# Patient Record
Sex: Female | Born: 1960 | Race: White | Hispanic: No | Marital: Married | Smoking: Former smoker
Health system: Southern US, Community
[De-identification: ages and names within clinical notes are randomized; demographics above are authoritative.]

## PROBLEM LIST (undated history)

## (undated) DIAGNOSIS — L570 Actinic keratosis: Secondary | ICD-10-CM

## (undated) DIAGNOSIS — I10 Essential (primary) hypertension: Secondary | ICD-10-CM

## (undated) DIAGNOSIS — F419 Anxiety disorder, unspecified: Secondary | ICD-10-CM

## (undated) DIAGNOSIS — M1990 Unspecified osteoarthritis, unspecified site: Secondary | ICD-10-CM

## (undated) DIAGNOSIS — T7840XA Allergy, unspecified, initial encounter: Secondary | ICD-10-CM

## (undated) DIAGNOSIS — F32A Depression, unspecified: Secondary | ICD-10-CM

## (undated) HISTORY — DX: Actinic keratosis: L57.0

## (undated) HISTORY — PX: TUBAL LIGATION: SHX77

## (undated) HISTORY — PX: ESSURE TUBAL LIGATION: SUR464

## (undated) HISTORY — DX: Essential (primary) hypertension: I10

## (undated) HISTORY — DX: Unspecified osteoarthritis, unspecified site: M19.90

## (undated) HISTORY — PX: DILATION AND CURETTAGE OF UTERUS: SHX78

## (undated) HISTORY — DX: Anxiety disorder, unspecified: F41.9

## (undated) HISTORY — DX: Allergy, unspecified, initial encounter: T78.40XA

## (undated) HISTORY — DX: Depression, unspecified: F32.A

## (undated) HISTORY — PX: OTHER SURGICAL HISTORY: SHX169

## (undated) HISTORY — PX: TONSILLECTOMY: SUR1361

---

## 2007-07-05 ENCOUNTER — Ambulatory Visit: Payer: Self-pay | Admitting: Unknown Physician Specialty

## 2011-09-05 ENCOUNTER — Ambulatory Visit: Payer: Self-pay | Admitting: Family Medicine

## 2012-03-08 ENCOUNTER — Ambulatory Visit: Payer: Self-pay | Admitting: Gastroenterology

## 2014-10-06 LAB — HEPATIC FUNCTION PANEL
ALT: 18 U/L (ref 7–35)
AST: 22 U/L (ref 13–35)
Alkaline Phosphatase: 103 U/L (ref 25–125)
Bilirubin, Total: 0.3 mg/dL

## 2014-10-06 LAB — HEMOGLOBIN A1C: Hemoglobin A1C: 5.8

## 2014-10-06 LAB — CBC AND DIFFERENTIAL
HCT: 40 % (ref 36–46)
Hemoglobin: 13.3 g/dL (ref 12.0–16.0)
Neutrophils Absolute: 2 /uL
Platelets: 239 10*3/uL (ref 150–399)
WBC: 3.9 10^3/mL

## 2014-10-06 LAB — LIPID PANEL
Cholesterol: 175 mg/dL (ref 0–200)
HDL: 57 mg/dL (ref 35–70)
LDL Cholesterol: 90 mg/dL
Triglycerides: 139 mg/dL (ref 40–160)

## 2014-10-06 LAB — BASIC METABOLIC PANEL
BUN: 14 mg/dL (ref 4–21)
Creatinine: 0.8 mg/dL (ref 0.5–1.1)
Glucose: 91 mg/dL
Potassium: 4.2 mmol/L (ref 3.4–5.3)
Sodium: 142 mmol/L (ref 137–147)

## 2014-10-06 LAB — TSH: TSH: 2.49 u[IU]/mL (ref 0.41–5.90)

## 2014-10-27 ENCOUNTER — Other Ambulatory Visit: Payer: Self-pay | Admitting: Family Medicine

## 2014-10-27 DIAGNOSIS — M62838 Other muscle spasm: Secondary | ICD-10-CM | POA: Insufficient documentation

## 2014-12-09 ENCOUNTER — Other Ambulatory Visit: Payer: Self-pay | Admitting: Family Medicine

## 2014-12-09 DIAGNOSIS — G4700 Insomnia, unspecified: Secondary | ICD-10-CM

## 2014-12-10 DIAGNOSIS — G4700 Insomnia, unspecified: Secondary | ICD-10-CM | POA: Insufficient documentation

## 2014-12-10 NOTE — Telephone Encounter (Signed)
Last ov 09/2014

## 2015-01-29 ENCOUNTER — Other Ambulatory Visit: Payer: Self-pay | Admitting: Family Medicine

## 2015-01-29 DIAGNOSIS — J309 Allergic rhinitis, unspecified: Secondary | ICD-10-CM

## 2015-01-29 NOTE — Telephone Encounter (Signed)
Last ov was on 10/02/2014.  Thanks,

## 2015-03-03 ENCOUNTER — Telehealth: Payer: Self-pay | Admitting: Family Medicine

## 2015-03-13 ENCOUNTER — Ambulatory Visit (INDEPENDENT_AMBULATORY_CARE_PROVIDER_SITE_OTHER): Payer: 59

## 2015-03-13 DIAGNOSIS — Z23 Encounter for immunization: Secondary | ICD-10-CM

## 2015-07-01 DIAGNOSIS — R7303 Prediabetes: Secondary | ICD-10-CM | POA: Insufficient documentation

## 2015-07-01 DIAGNOSIS — Z87898 Personal history of other specified conditions: Secondary | ICD-10-CM | POA: Insufficient documentation

## 2015-07-01 DIAGNOSIS — F419 Anxiety disorder, unspecified: Secondary | ICD-10-CM | POA: Insufficient documentation

## 2015-07-01 DIAGNOSIS — L989 Disorder of the skin and subcutaneous tissue, unspecified: Secondary | ICD-10-CM | POA: Insufficient documentation

## 2015-07-01 DIAGNOSIS — IMO0001 Reserved for inherently not codable concepts without codable children: Secondary | ICD-10-CM | POA: Insufficient documentation

## 2015-07-01 DIAGNOSIS — R739 Hyperglycemia, unspecified: Secondary | ICD-10-CM | POA: Insufficient documentation

## 2015-07-01 DIAGNOSIS — N951 Menopausal and female climacteric states: Secondary | ICD-10-CM | POA: Insufficient documentation

## 2015-07-01 DIAGNOSIS — R030 Elevated blood-pressure reading, without diagnosis of hypertension: Secondary | ICD-10-CM

## 2015-07-01 DIAGNOSIS — N950 Postmenopausal bleeding: Secondary | ICD-10-CM | POA: Insufficient documentation

## 2015-07-01 DIAGNOSIS — M8580 Other specified disorders of bone density and structure, unspecified site: Secondary | ICD-10-CM | POA: Insufficient documentation

## 2015-07-01 DIAGNOSIS — Z6828 Body mass index (BMI) 28.0-28.9, adult: Secondary | ICD-10-CM

## 2015-07-01 DIAGNOSIS — J309 Allergic rhinitis, unspecified: Secondary | ICD-10-CM | POA: Insufficient documentation

## 2015-07-19 ENCOUNTER — Other Ambulatory Visit: Payer: Self-pay | Admitting: Family Medicine

## 2015-07-19 DIAGNOSIS — F419 Anxiety disorder, unspecified: Secondary | ICD-10-CM

## 2015-07-19 NOTE — Telephone Encounter (Signed)
Last FU/CPE was 10/02/2014. Renaldo Fiddler, CMA

## 2015-10-04 ENCOUNTER — Encounter: Payer: Self-pay | Admitting: Family Medicine

## 2015-11-17 ENCOUNTER — Encounter: Payer: Self-pay | Admitting: Family Medicine

## 2015-11-17 ENCOUNTER — Ambulatory Visit (INDEPENDENT_AMBULATORY_CARE_PROVIDER_SITE_OTHER): Payer: 59 | Admitting: Family Medicine

## 2015-11-17 VITALS — BP 130/88 | HR 84 | Temp 98.5°F | Resp 16 | Ht 64.0 in | Wt 166.0 lb

## 2015-11-17 DIAGNOSIS — Z1231 Encounter for screening mammogram for malignant neoplasm of breast: Secondary | ICD-10-CM

## 2015-11-17 DIAGNOSIS — G4700 Insomnia, unspecified: Secondary | ICD-10-CM | POA: Diagnosis not present

## 2015-11-17 DIAGNOSIS — M545 Low back pain, unspecified: Secondary | ICD-10-CM | POA: Insufficient documentation

## 2015-11-17 DIAGNOSIS — J309 Allergic rhinitis, unspecified: Secondary | ICD-10-CM

## 2015-11-17 DIAGNOSIS — M5441 Lumbago with sciatica, right side: Secondary | ICD-10-CM | POA: Diagnosis not present

## 2015-11-17 DIAGNOSIS — J3089 Other allergic rhinitis: Secondary | ICD-10-CM | POA: Diagnosis not present

## 2015-11-17 DIAGNOSIS — M62838 Other muscle spasm: Secondary | ICD-10-CM

## 2015-11-17 DIAGNOSIS — F419 Anxiety disorder, unspecified: Secondary | ICD-10-CM | POA: Diagnosis not present

## 2015-11-17 DIAGNOSIS — Z0000 Encounter for general adult medical examination without abnormal findings: Secondary | ICD-10-CM

## 2015-11-17 DIAGNOSIS — R739 Hyperglycemia, unspecified: Secondary | ICD-10-CM | POA: Diagnosis not present

## 2015-11-17 DIAGNOSIS — Z1211 Encounter for screening for malignant neoplasm of colon: Secondary | ICD-10-CM | POA: Insufficient documentation

## 2015-11-17 LAB — POCT URINALYSIS DIPSTICK
Blood, UA: NEGATIVE
Glucose, UA: NEGATIVE
Ketones, UA: NEGATIVE
Leukocytes, UA: NEGATIVE
Nitrite, UA: NEGATIVE
Protein, UA: NEGATIVE
Spec Grav, UA: 1.01
Urobilinogen, UA: 0.2
pH, UA: 7.5

## 2015-11-17 MED ORDER — SERTRALINE HCL 50 MG PO TABS: 50.0000 mg | ORAL_TABLET | Freq: Every day | ORAL | Status: DC

## 2015-11-17 MED ORDER — AZELASTINE HCL 0.1 % NA SOLN: 2.0000 | Freq: Two times a day (BID) | NASAL | Status: DC

## 2015-11-17 MED ORDER — TRAZODONE HCL 50 MG PO TABS: 50.0000 mg | ORAL_TABLET | Freq: Every day | ORAL | Status: DC

## 2015-11-17 MED ORDER — FLUTICASONE PROPIONATE 50 MCG/ACT NA SUSP: 2.0000 | Freq: Every day | NASAL | Status: DC

## 2015-11-17 MED ORDER — BACLOFEN 10 MG PO TABS: 10.0000 mg | ORAL_TABLET | Freq: Every evening | ORAL | Status: DC | PRN

## 2015-11-17 NOTE — Progress Notes (Signed)
Patient: Rachel Ali, Female    DOB: [birth year], 55 y.o.   MRN: [id number] Visit Date: 11/17/2015  Today's Provider: Margarita Rana, MD   Chief Complaint  Patient presents with  . Annual Exam  . Back Pain   Subjective:    Annual physical exam LEONE KING is a 55 y.o. female who presents today for health maintenance and complete physical. She feels fairly well. She reports exercising 5 days a week on treadmill and during her breaks at work. She reports she is sleeping well.  Last CPE- 10/02/2014 Last pap- 12/08/2011- Negative. HPV negative Last colonoscopy-  July 2013. Dr. Candace Cruise. Normal. Last mammogram- 12/26/2012- BI-RADS 1 -----------------------------------------------------------------  Back Pain: Patient presents for presents evaluation of low back problems.  Symptoms have been present for 5 years and include pain in right hip (shooting in character; 6/10 in severity). Initial inciting event: none. Alleviating factors identifiable by patient are medication Baclofen and chiropractic manipulation. Exacerbating factors identifiable by patient are sitting.    Review of Systems  Constitutional: Positive for fatigue.  Musculoskeletal: Positive for back pain.  All other systems reviewed and are negative.   Social History      She  reports that she has quit smoking. She has never used smokeless tobacco. She reports that she drinks alcohol. She reports that she does not use illicit drugs.       Social History   Social History  . Marital Status: Married    Spouse Name: Magda Paganini  . Number of Children: 1  . Years of Education: Associates   Occupational History  .  Lab [employer]   Social History Main Topics  . Smoking status: Former Smoker -- 1.00 packs/day for 5 years  . Smokeless tobacco: Never Used  . Alcohol Use: Yes     Comment: rare  . Drug Use: No  . Sexual Activity: Yes    Birth Control/ Protection: Post-menopausal   Other Topics Concern  . None   Social History  Narrative    History reviewed. No pertinent past medical history.   Patient Active Problem List   Diagnosis Date Noted  . Allergic rhinitis 07/01/2015  . Anxiety 07/01/2015  . Body mass index (BMI) of 27.0-27.9 in adult 07/01/2015  . Blood pressure elevated 07/01/2015  . H/O disease 07/01/2015  . Blood glucose elevated 07/01/2015  . Climacteric 07/01/2015  . Osteopenia 07/01/2015  . Hemorrhage, postmenopausal 07/01/2015  . Skin lesion 07/01/2015  . Insomnia 12/10/2014  . Muscle spasm 10/27/2014    Past Surgical History  Procedure Laterality Date  . Tonsillectomy    . Other surgical history      coil placement in tubes to prevent pregnancy-patient could not recall the name of the procedure  . Dilation and curettage of uterus    . Essure tubal ligation      Family History        Family Status  Relation Status Death Age  . Mother Alive   . Father Deceased 80  . Brother Alive   . Maternal Grandmother Deceased 71  . Maternal Grandfather Deceased   . Paternal Grandmother Deceased 7  . Paternal Grandfather Deceased         Her family history includes Cancer in her father; Congestive Heart Failure in her maternal grandmother and paternal grandmother; Heart disease in her father and maternal grandfather; Hypertension in her brother, father, and mother; Stroke in her paternal grandfather; Uterine cancer in her mother.  Allergies  Allergen Reactions  . Cinnamon     mild Trouble breathing.  Marland Kitchen Penicillins     Itching    Current Meds  Medication Sig  . azelastine (ASTELIN) 0.1 % nasal spray Place into the nose.  . baclofen (LIORESAL) 10 MG tablet TAKE 1 TABLET BY MOUTH EVERY EVENING AS NEEDED FOR MUSCLE SPASMS  . Caffeine-Magnesium Salicylate (DIUREX) XX123456 MG TABS Take by mouth.  . calcium gluconate 500 MG tablet Take by mouth.  . Cholecalciferol 1000 units capsule Take by mouth.  . diphenhydrAMINE (SOMINEX) 25 MG tablet Take 25 mg by mouth at bedtime as needed  for sleep.  Marland Kitchen FLAXSEED, LINSEED, PO Take by mouth.  . fluticasone (FLONASE) 50 MCG/ACT nasal spray Use 2 sprays nasally daily  . Ibuprofen 200 MG CAPS Take by mouth.  . Multiple Vitamin tablet Take by mouth.  . sertraline (ZOLOFT) 50 MG tablet Take 1 tablet by mouth in  the morning  . traZODone (DESYREL) 50 MG tablet Take 1 tablet by mouth  every evening    Patient Care Team: Margarita Rana, MD as PCP - General (Family Medicine)     Objective:   Vitals: BP 130/88 mmHg  Pulse 84  Temp(Src) 98.5 F (36.9 C) (Oral)  Resp 16  Ht 5\' 4"  (1.626 m)  Wt 166 lb (75.297 kg)  BMI 28.48 kg/m2   Physical Exam  Constitutional: She is oriented to person, place, and time. She appears well-developed and well-nourished.  HENT:  Head: Normocephalic and atraumatic.  Right Ear: Tympanic membrane, external ear and ear canal normal.  Left Ear: Tympanic membrane, external ear and ear canal normal.  Nose: Nose normal.  Mouth/Throat: Uvula is midline, oropharynx is clear and moist and mucous membranes are normal.  Eyes: Conjunctivae, EOM and lids are normal. Pupils are equal, round, and reactive to light.  Neck: Trachea normal and normal range of motion. Neck supple. Carotid bruit is not present. No thyroid mass and no thyromegaly present.  Cardiovascular: Normal rate, regular rhythm and normal heart sounds.   Pulmonary/Chest: Effort normal and breath sounds normal.  Abdominal: Soft. Normal appearance and bowel sounds are normal. There is no hepatosplenomegaly. There is no tenderness.  Genitourinary: No breast swelling, tenderness or discharge.  Musculoskeletal: Normal range of motion.  Lymphadenopathy:    She has no cervical adenopathy.    She has no axillary adenopathy.  Neurological: She is alert and oriented to person, place, and time. She has normal strength. No cranial nerve deficit.  Skin: Skin is warm, dry and intact.  Psychiatric: She has a normal mood and affect. Her speech is normal and  behavior is normal. Judgment and thought content normal. Cognition and memory are normal.     Depression Screen No flowsheet data found.    Assessment & Plan:     Routine Health Maintenance and Physical Exam  Exercise Activities and Dietary recommendations Goals    None      Immunization History  Administered Date(s) Administered  . Influenza,inj,Quad PF,36+ Mos 03/13/2015      Discussed health benefits of physical activity, and encouraged her to engage in regular exercise appropriate for her age and condition.    1. Annual physical exam Stable. UA negative. Continue exercising. Will need Pap at next CPE.  Results for orders placed or performed in visit on 11/17/15  POCT urinalysis dipstick  Result Value Ref Range   Color, UA Yellow    Clarity, UA Clear    Glucose, UA Negative  Bilirubin, UA Small    Ketones, UA Negative    Spec Grav, UA 1.010    Blood, UA Negative    pH, UA 7.5    Protein, UA Negative    Urobilinogen, UA 0.2    Nitrite, UA Negative    Leukocytes, UA Negative Negative    - POCT urinalysis dipstick  2. Blood glucose elevated Check labs as below. FU pending results. - CBC with Differential/Platelet - Comprehensive metabolic panel - TSH - Hemoglobin A1c  3. Right-sided low back pain with right-sided sciatica Worsening. Refer to Dr. Sharlet Salina as below. - Ambulatory referral to Physical Medicine Rehab  4. Anxiety Stable. Refills provided. - sertraline (ZOLOFT) 50 MG tablet; Take 1 tablet (50 mg total) by mouth daily.  Dispense: 90 tablet; Refill: 1  5. Insomnia Stable. Refills provided. - traZODone (DESYREL) 50 MG tablet; Take 1 tablet (50 mg total) by mouth at bedtime.  Dispense: 90 tablet; Refill: 1  6. Other allergic rhinitis Stable. Refills provided. - azelastine (ASTELIN) 0.1 % nasal spray; Place 2 sprays into both nostrils 2 (two) times daily.  Dispense: 90 mL; Refill: 1   7. Muscle spasm Stable. Refills provided. - baclofen  (LIORESAL) 10 MG tablet; Take 1 tablet (10 mg total) by mouth at bedtime as needed for muscle spasms.  Dispense: 90 tablet; Refill: 1  8. Allergic rhinitis, unspecified allergic rhinitis type Stable. Refills provided. - fluticasone (FLONASE) 50 MCG/ACT nasal spray; Place 2 sprays into both nostrils daily.  Dispense: 48 g; Refill: 1  9. Encounter for screening mammogram for breast cancer Order faxed. - MM DIGITAL SCREENING BILATERAL; Future -------------------------------------------------------------------- Patient seen and examined by Jerrell Belfast, MD, and note scribed by Renaldo Fiddler, CMA.   I have reviewed the document for accuracy and completeness and I agree with above. - Jerrell Belfast, MD    Margarita Rana, MD  [facility]

## 2015-11-18 ENCOUNTER — Telehealth: Payer: Self-pay

## 2015-11-18 LAB — COMPREHENSIVE METABOLIC PANEL
ALT: 19 IU/L (ref 0–32)
AST: 21 IU/L (ref 0–40)
Albumin/Globulin Ratio: 2 (ref 1.2–2.2)
Albumin: 4.5 g/dL (ref 3.5–5.5)
Alkaline Phosphatase: 115 IU/L (ref 39–117)
BUN/Creatinine Ratio: 22 (ref 9–23)
BUN: 18 mg/dL (ref 6–24)
Bilirubin Total: 0.2 mg/dL (ref 0.0–1.2)
CO2: 25 mmol/L (ref 18–29)
Calcium: 9.1 mg/dL (ref 8.7–10.2)
Chloride: 100 mmol/L (ref 96–106)
Creatinine, Ser: 0.83 mg/dL (ref 0.57–1.00)
GFR calc Af Amer: 92 mL/min/{1.73_m2} (ref >59)
GFR calc non Af Amer: 80 mL/min/{1.73_m2} (ref >59)
Globulin, Total: 2.2 g/dL (ref 1.5–4.5)
Glucose: 95 mg/dL (ref 65–99)
Potassium: 4.1 mmol/L (ref 3.5–5.2)
Sodium: 140 mmol/L (ref 134–144)
Total Protein: 6.7 g/dL (ref 6.0–8.5)

## 2015-11-18 LAB — CBC WITH DIFFERENTIAL/PLATELET
Basophils Absolute: 0 10*3/uL (ref 0.0–0.2)
Basos: 1 %
EOS (ABSOLUTE): 0.2 10*3/uL (ref 0.0–0.4)
Eos: 3 %
Hematocrit: 37.3 % (ref 34.0–46.6)
Hemoglobin: 12.3 g/dL (ref 11.1–15.9)
Immature Grans (Abs): 0 10*3/uL (ref 0.0–0.1)
Immature Granulocytes: 0 %
Lymphocytes Absolute: 1.8 10*3/uL (ref 0.7–3.1)
Lymphs: 33 %
MCH: 31.1 pg (ref 26.6–33.0)
MCHC: 33 g/dL (ref 31.5–35.7)
MCV: 94 fL (ref 79–97)
Monocytes Absolute: 0.4 10*3/uL (ref 0.1–0.9)
Monocytes: 8 %
Neutrophils Absolute: 3 10*3/uL (ref 1.4–7.0)
Neutrophils: 55 %
Platelets: 257 10*3/uL (ref 150–379)
RBC: 3.96 x10E6/uL (ref 3.77–5.28)
RDW: 12.7 % (ref 12.3–15.4)
WBC: 5.4 10*3/uL (ref 3.4–10.8)

## 2015-11-18 LAB — HEMOGLOBIN A1C
Est. average glucose Bld gHb Est-mCnc: 108 mg/dL
Hgb A1c MFr Bld: 5.4 % (ref 4.8–5.6)

## 2015-11-18 LAB — TSH: TSH: 1.47 u[IU]/mL (ref 0.450–4.500)

## 2015-11-18 NOTE — Telephone Encounter (Signed)
LMTCB 11/18/2015  Thanks,   -Mickel Baas

## 2015-11-18 NOTE — Telephone Encounter (Signed)
Pt advised.   Thanks,   -Laura  

## 2015-11-18 NOTE — Telephone Encounter (Signed)
-----   Message from Margarita Rana, MD sent at 11/18/2015 10:34 AM EDT ----- Labs stable.  Please notify patient. Thanks.

## 2015-12-28 NOTE — Telephone Encounter (Signed)
error 

## 2016-01-03 ENCOUNTER — Other Ambulatory Visit: Payer: Self-pay | Admitting: Physical Medicine and Rehabilitation

## 2016-01-03 DIAGNOSIS — M5416 Radiculopathy, lumbar region: Secondary | ICD-10-CM

## 2016-01-07 ENCOUNTER — Ambulatory Visit
Admission: RE | Admit: 2016-01-07 | Discharge: 2016-01-07 | Disposition: A | Discharge status: Home / Self Care | Payer: 59 | Source: Ambulatory Visit | Attending: Physical Medicine and Rehabilitation | Admitting: Physical Medicine and Rehabilitation

## 2016-01-07 DIAGNOSIS — M5416 Radiculopathy, lumbar region: Secondary | ICD-10-CM | POA: Diagnosis present

## 2016-01-07 DIAGNOSIS — M5116 Intervertebral disc disorders with radiculopathy, lumbar region: Secondary | ICD-10-CM | POA: Insufficient documentation

## 2016-01-25 ENCOUNTER — Other Ambulatory Visit: Payer: Self-pay | Admitting: Family Medicine

## 2016-01-25 DIAGNOSIS — F419 Anxiety disorder, unspecified: Secondary | ICD-10-CM

## 2016-02-16 ENCOUNTER — Ambulatory Visit: Payer: 59 | Attending: Physical Medicine and Rehabilitation

## 2016-02-16 DIAGNOSIS — M25551 Pain in right hip: Secondary | ICD-10-CM | POA: Diagnosis present

## 2016-02-16 DIAGNOSIS — M6281 Muscle weakness (generalized): Secondary | ICD-10-CM | POA: Diagnosis present

## 2016-02-16 DIAGNOSIS — M5441 Lumbago with sciatica, right side: Secondary | ICD-10-CM | POA: Insufficient documentation

## 2016-02-16 NOTE — Patient Instructions (Addendum)
   Gave prone on elbows 2 min x 3  And seated lumbar towel roll position (to be performed whenever pt is sitting) as part of her HEP. Pt demonstrated and verbalized understanding.     Patient was recommended to hold off on using the seated hip abduction machine at the gym secondary to possible contributing factor to her R hip symptoms. Pt verbalized understanding.

## 2016-02-16 NOTE — Therapy (Signed)
[facility] [address] [place], [address] Phone: [phone]   Fax:  [phone]  Physical Therapy Evaluation  Patient Details  Name: Rachel Ali MRN: [id number] Date of Birth: 1961-03-06 Referring Provider: Sharlet Salina, DO  Encounter Date: 02/16/2016      PT End of Session - 02/16/16 1608    Visit Number 1   Number of Visits 13   Date for PT Re-Evaluation 03/30/16   PT Start Time 1608  Pt states wanting to do PT here   PT Stop Time 1719   PT Time Calculation (min) 71 min   Activity Tolerance Patient tolerated treatment well   Behavior During Therapy [facility] for tasks assessed/performed      Past Medical History:  Diagnosis Date  . Allergy    sinus problem    Past Surgical History:  Procedure Laterality Date  . DILATION AND CURETTAGE OF UTERUS    . ESSURE TUBAL LIGATION    . OTHER SURGICAL HISTORY     coil placement in tubes to prevent pregnancy-patient could not recall the name of the procedure  . TONSILLECTOMY      There were no vitals filed for this visit.       Subjective Assessment - 02/16/16 1614    Subjective R low back/posterior hip pain. 2-3/10 current and best; 5/10 at worst when traveling, getting in and out of a passenger seat of the car.    Pertinent History Back and R posterior hip pain. Has had pain for 2 years, sudden onset, unknown method of injury. 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 Went to Dr Sharlet Salina, had an MRI which revealed a little bit of arthritis. Everything else looked good. Denies bowel or bladder problems or saddle anesthesia. Sometimes R foot feels like it is going to fall asleep (R lateral plantar surface).  Pt also adds that she slipped and fell on ice while walking to work 2 years ago and landed on her knee (does not know which knee).     Patient Stated Goals Try to work around the pain. Try to figure out what I am doing  to aggravate it.    Currently in Pain? Yes   Pain Score 3    Pain Location Hip   Pain Orientation Right   Pain Descriptors / Indicators Stabbing;Aching  catching, pressure   Pain Type Chronic pain   Pain Onset More than a month ago   Pain Frequency Constant   Aggravating Factors  sitting for about an hour, getting into and out of the car, working out her legs (prone resisted knee flexion, seated resisted hip abduction machine, seated resisted hip adduction machine, abdominal crunch machine); R S/L    Pain Relieving Factors getting up and walking after sitting, ice, laying down on L side            [facility] PT Assessment - 02/16/16 1627      Assessment   Medical Diagnosis Lumbar DDD, lumbar radiculitis   Referring Provider Sharlet Salina, DO   Onset Date/Surgical Date 01/13/16  Date PT order signed. Pain started 2 years ago.    Next MD Visit 03/10/2016   Prior Therapy No known physical therapy for current condition     Precautions   Precaution Comments No known precautions     Restrictions   Other Position/Activity Restrictions No known restrictions     Balance Screen  Has the patient fallen in the past 6 months No   Has the patient had a decrease in activity level because of a fear of falling?  No   Is the patient reluctant to leave their home because of a fear of falling?  No     Home Environment   Additional Comments Patient lives in a 2 story home with family, 5 steps to enter, R rail, 16 steps inside to the second floor, R rail     Prior Function   Vocation Full time employment  [employer]   Vocation Requirements PLOF: better able to tolerate sitting for longer periods for her job and travel   Leisure crafts     Observation/Other Assessments   Observations Repeated flexion 10x. Increased pain initially but improved towards the end., then pain returned when back in the starting position to baseline levels. Repeated extension decreased pain to 0/10,  (+) Slump test  bilateral LE but reproduction of R posterior hip and thigh pain when performed for the R LE.  (+) R piriformis test with reproduction of symptoms. (-) piriformis test L LE. (+) FABER test R hip with reproduction of symptoms. R anterior hip and lateral hip discomfort with supine R hip flexion, adduction and IR.    Modified Oswertry 24%     Posture/Postural Control   Posture Comments Bilaterally protracted shoulders and neck, R shoulder lower, R foot slightly forward compared to L, slight L lateral shift, bilateral femoral adduction and IR     AROM   Lumbar Flexion WFL with R low back/posterior hip pain   Lumbar Extension limited, no pain   Lumbar - Right Side Bend WFL   Lumbar - Left Side Bend WFL with R posterior low back/hip symptoms   Lumbar - Right Rotation limited   Lumbar - Left Rotation [facility]     Strength   Right Hip Flexion 4/5   Right Hip Extension 4/5   Right Hip External Rotation  4+/5   Right Hip ABduction 4+/5   Left Hip Flexion 4+/5   Left Hip Extension 4-/5   Left Hip External Rotation 4+/5   Left Hip ABduction 4/5   Right Knee Flexion 4+/5   Right Knee Extension 5/5   Left Knee Flexion 4+/5   Left Knee Extension 5/5     Palpation   Palpation comment TTP R pirifomis/posterior hip area.  L L5, L4, L3, L2, L1 TP with decreased mobility.      Ambulation/Gait   Gait Comments Contralateral pelvic drop during stance phase          Objectives  There-ex   Directed patient with prone on elbows x 2 min  Sitting with lumbar towel roll 2 min  Seated hip adductor pillow squeeze 10x5 seconds for 2 sets.   Increased R anterior medial hip and posterior lateral hip discomfort. Decreased with rest.   Reviewed HEP. Pt demonstrated and verbalized understanding.     Improved exercise technique, movement at target joints, use of target muscles after mod verbal, visual, tactile cues.      No pain afterwards with back extension exercises                   PT Education - 02/16/16 1946    Education provided Yes   Education Details ther-ex, HEP, plan of care   Person(s) Educated Patient   Methods Explanation;Demonstration;Tactile cues;Verbal cues   Comprehension Returned demonstration;Verbalized understanding  PT Long Term Goals - 02/16/16 1901      PT LONG TERM GOAL #1   Title Patient will have a decrease in low back and R hip pain to 2/10 or less at most to promote sitting tolerance for work and travel.    Baseline 5/10 at worst   Time 6   Period Weeks   Status New     PT LONG TERM GOAL #2   Title Patient will improve her Modified Oswestry Low Back Pain Disability Questionnaire by at least 10% as a demonstration of improved function.    Baseline 24%   Time 6   Period Weeks   Status New     PT LONG TERM GOAL #3   Title Patient will report being able to sit for at least 1.5 hours with 2/10 or less low back and R hip pain at worst to promote ability to perform her work tasks.    Baseline Increased pain to 5/10 after sitting for an hour   Time 6   Period Weeks   Status New     PT LONG TERM GOAL #4   Title Patient will improve bilateral hip extension strength by at least 1/2 MMT grade to promote ability to perform standing tasks with less low back and R hip pain.    Time 6   Period Weeks   Status New               Plan - 02/16/16 1725    Clinical Impression Statement Patient is a 55 year old female who came to physical therapy secondary to low back and R leg pain. She also presents with reproduction of symptoms with lumbar flexion and L side bending; R LE neural tension, positive special test suggesting R piriformis involvement;  TTP to low back and R posterior hip, decreased lumbar mobility, bilateral glute max weakness, and difficulty performing functional tasks such as getting into and out of the car,  and tolerating the sitting position which make work and travel challenging. Patient will benefit from  skilled physical therapy services to address the aforementioned deficits.    Rehab Potential Good   Clinical Impairments Affecting Rehab Potential chronicity of condition   PT Frequency 2x / week   PT Duration 6 weeks   PT Treatment/Interventions Therapeutic exercise;Therapeutic activities;Manual techniques;Dry needling;Neuromuscular re-education;Patient/family education;Traction;Ultrasound;Electrical Stimulation;Aquatic Therapy   PT Next Visit Plan posture, trunk extension, hip strengthening, manual therapy, lumbar and thoracic mobility   Consulted and Agree with Plan of Care Patient      Patient will benefit from skilled therapeutic intervention in order to improve the following deficits and impairments:  Pain, Decreased strength, Hypomobility  Visit Diagnosis: Right-sided low back pain with right-sided sciatica - Plan: PT plan of care cert/re-cert  Pain in right hip - Plan: PT plan of care cert/re-cert  Muscle weakness (generalized) - Plan: PT plan of care cert/re-cert     Problem List Patient Active Problem List   Diagnosis Date Noted  . Annual physical exam 11/17/2015  . Low back pain 11/17/2015  . Allergic rhinitis 07/01/2015  . Anxiety 07/01/2015  . Body mass index (BMI) of 27.0-27.9 in adult 07/01/2015  . Blood pressure elevated 07/01/2015  . H/O disease 07/01/2015  . Blood glucose elevated 07/01/2015  . Climacteric 07/01/2015  . Osteopenia 07/01/2015  . Hemorrhage, postmenopausal 07/01/2015  . Skin lesion 07/01/2015  . Insomnia 12/10/2014  . Muscle spasm 10/27/2014    Thank you for your referral.   Joneen Boers  PT, DPT   02/16/2016, 7:53 PM  [facility] [address] [place], [address] Phone: [phone]   Fax:  [phone]  Name: Rachel Ali MRN: [id number] Date of Birth: Sep 13, 1960

## 2016-02-21 ENCOUNTER — Ambulatory Visit: Payer: 59 | Attending: Physical Medicine and Rehabilitation

## 2016-02-21 DIAGNOSIS — G8929 Other chronic pain: Secondary | ICD-10-CM | POA: Diagnosis present

## 2016-02-21 DIAGNOSIS — M25551 Pain in right hip: Secondary | ICD-10-CM | POA: Diagnosis not present

## 2016-02-21 DIAGNOSIS — M6281 Muscle weakness (generalized): Secondary | ICD-10-CM

## 2016-02-21 DIAGNOSIS — M5441 Lumbago with sciatica, right side: Secondary | ICD-10-CM | POA: Diagnosis present

## 2016-02-21 NOTE — Patient Instructions (Signed)
   Sitting on your chair:  Press your right hand down onto your right knee as you raise your right knee up against your hand.    Hold for 5 seconds.    Repeat 10 times.    Perform 3 sets daily.

## 2016-02-21 NOTE — Therapy (Signed)
[facility] [address], [place], [address] Phone: [phone]   Fax:  [phone]  Physical Therapy Treatment  Patient Details  Name: Rachel Ali MRN: [id number] Date of Birth: September 18, 1960 Referring Provider: Sharlet Salina, DO  Encounter Date: 02/21/2016      PT End of Session - 02/21/16 1518    Visit Number 2   Number of Visits 13   Date for PT Re-Evaluation 03/30/16   PT Start Time 1518   PT Stop Time 1601   PT Time Calculation (min) 43 min   Activity Tolerance Patient tolerated treatment well   Behavior During Therapy [facility] for tasks assessed/performed      Past Medical History:  Diagnosis Date  . Allergy    sinus problem    Past Surgical History:  Procedure Laterality Date  . DILATION AND CURETTAGE OF UTERUS    . ESSURE TUBAL LIGATION    . OTHER SURGICAL HISTORY     coil placement in tubes to prevent pregnancy-patient could not recall the name of the procedure  . TONSILLECTOMY      There were no vitals filed for this visit.      Subjective Assessment - 02/21/16 1519    Subjective I can tell a little bit of a difference but its still there. 2/10 low back pain currently (with lumbar towel roll), 3/10 without towel roll when sitting.    Pertinent History Back and R posterior hip pain. Has had pain for 2 years, sudden onset, unknown method of injury. 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 Went to Dr Sharlet Salina, had an MRI which revealed a little bit of arthritis. Everything else looked good. Denies bowel or bladder problems or saddle anesthesia. Sometimes R foot feels like it is going to fall asleep (R lateral plantar surface).  Pt also adds that she slipped and fell on ice while walking to work 2 years ago and landed on her knee (does not know which knee).     Patient Stated Goals Try to work around the pain. Try to figure out what I am doing to aggravate it.    Currently in Pain? Yes   Pain Score 3    Pain Onset More than a month ago                                 PT Education - 02/21/16 1535    Education provided Yes   Education Details ther-ex, HEP   Person(s) Educated Patient   Methods Explanation;Demonstration;Tactile cues;Verbal cues;Handout   Comprehension Returned demonstration;Verbalized understanding    Objectives  Manual therapy  Prone UPA to R L5 TP grade 3. Decreased low back soreness. No change in pain    There-ex  Directed patient with prone press ups 10x5 seconds for 2 sets  Supine bridge with bilateral shoulder extension isometrics 10x3. Slight R low back and posterior hip discomfort which decreased with rest.   Seated R hip extension isometrics 10x5 seconds. Increased R low back/hip pain Seated R hip flexion isometrics 10x5 seconds for 3 sets. Decreased R low back/hip pain to 0/10 in sitting.   Standing bilateral shoulder extension to neutral resisting yellow band 10x2 with 5 seconds  Standing R shoulder adduction resisting yellow band 10x5 seconds to promote proper posture.   Then resisting red band 10x5 seconds for 2 sets  Improved exercise technique, movement at target joints, use of target muscles after mod verbal, visual, tactile cues.    Decreased low back pain with activation of R hip flexor muscles to promote R low back extension.         PT Long Term Goals - 02/16/16 1901      PT LONG TERM GOAL #1   Title Patient will have a decrease in low back and R hip pain to 2/10 or less at most to promote sitting tolerance for work and travel.    Baseline 5/10 at worst   Time 6   Period Weeks   Status New     PT LONG TERM GOAL #2   Title Patient will improve her Modified Oswestry Low Back Pain Disability Questionnaire by at least 10% as a demonstration of improved function.    Baseline 24%   Time 6   Period Weeks   Status New     PT LONG TERM GOAL #3   Title Patient will  report being able to sit for at least 1.5 hours with 2/10 or less low back and R hip pain at worst to promote ability to perform her work tasks.    Baseline Increased pain to 5/10 after sitting for an hour   Time 6   Period Weeks   Status New     PT LONG TERM GOAL #4   Title Patient will improve bilateral hip extension strength by at least 1/2 MMT grade to promote ability to perform standing tasks with less low back and R hip pain.    Time 6   Period Weeks   Status New               Plan - 02/21/16 1535    Clinical Impression Statement Decreased low back pain with activation of R hip flexor muscles to promote R low back extension.    Rehab Potential Good   Clinical Impairments Affecting Rehab Potential chronicity of condition   PT Frequency 2x / week   PT Duration 6 weeks   PT Treatment/Interventions Therapeutic exercise;Therapeutic activities;Manual techniques;Dry needling;Neuromuscular re-education;Patient/family education;Traction;Ultrasound;Electrical Stimulation;Aquatic Therapy   PT Next Visit Plan posture, trunk extension, hip strengthening, manual therapy, lumbar and thoracic mobility   Consulted and Agree with Plan of Care Patient      Patient will benefit from skilled therapeutic intervention in order to improve the following deficits and impairments:  Pain, Decreased strength, Hypomobility  Visit Diagnosis: Pain in right hip  Muscle weakness (generalized)  Chronic right-sided low back pain with right-sided sciatica     Problem List Patient Active Problem List   Diagnosis Date Noted  . Annual physical exam 11/17/2015  . Low back pain 11/17/2015  . Allergic rhinitis 07/01/2015  . Anxiety 07/01/2015  . Body mass index (BMI) of 27.0-27.9 in adult 07/01/2015  . Blood pressure elevated 07/01/2015  . H/O disease 07/01/2015  . Blood glucose elevated 07/01/2015  . Climacteric 07/01/2015  . Osteopenia 07/01/2015  . Hemorrhage, postmenopausal 07/01/2015  .  Skin lesion 07/01/2015  . Insomnia 12/10/2014  . Muscle spasm 10/27/2014    Joneen Boers PT, DPT   02/21/2016, 5:29 PM  [facility] [address] [place], [address] Phone: [phone]   Fax:  [phone]  Name: [name] MRN: [id number] Date of Birth: 04/02/1961

## 2016-02-24 ENCOUNTER — Ambulatory Visit: Payer: 59

## 2016-02-24 DIAGNOSIS — M25551 Pain in right hip: Secondary | ICD-10-CM

## 2016-02-24 DIAGNOSIS — M5441 Lumbago with sciatica, right side: Secondary | ICD-10-CM

## 2016-02-24 DIAGNOSIS — G8929 Other chronic pain: Secondary | ICD-10-CM

## 2016-02-24 DIAGNOSIS — M6281 Muscle weakness (generalized): Secondary | ICD-10-CM

## 2016-02-24 NOTE — Therapy (Signed)
[facility] [address] [place], [address] Phone: [phone]   Fax:  [phone]  Physical Therapy Treatment  Patient Details  Name: Rachel [facility] MRN: [id number] Date of Birth: 1960-09-20 Referring Provider: Sharlet Salina, DO  Encounter Date: 02/24/2016      PT End of Session - 02/24/16 1551    Visit Number 3   Number of Visits 13   Date for PT Re-Evaluation 03/30/16   PT Start Time [id number]   PT Stop Time 1636   PT Time Calculation (min) 45 min   Activity Tolerance Patient tolerated treatment well   Behavior During Therapy [facility] for tasks assessed/performed      Past Medical History:  Diagnosis Date  . Allergy    sinus problem    Past Surgical History:  Procedure Laterality Date  . DILATION AND CURETTAGE OF UTERUS    . ESSURE TUBAL LIGATION    . OTHER SURGICAL HISTORY     coil placement in tubes to prevent pregnancy-patient could not recall the name of the procedure  . TONSILLECTOMY      There were no vitals filed for this visit.      Subjective Assessment - 02/24/16 1553    Subjective Back does not hurt as bad in the low back but now huring a little more R anterior lateral thigh (2/10) and R anterior medial hip (2/10). 1/10 R low back/posterior hip area.    Pertinent History Back and R posterior hip pain. Has had pain for 2 years, sudden onset, unknown method of injury. 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 Went to Dr Sharlet Salina, had an MRI which revealed a little bit of arthritis. Everything else looked good. Denies bowel or bladder problems or saddle anesthesia. Sometimes R foot feels like it is going to fall asleep (R lateral plantar surface).  Pt also adds that she slipped and fell on ice while walking to work 2 years ago and landed on her knee (does not know which knee).     Patient Stated Goals Try to work around the pain. Try to figure out what I am doing to  aggravate it.    Currently in Pain? Yes   Pain Score 2    Pain Orientation Right   Pain Onset More than a month ago                                 PT Education - 02/24/16 1607    Education provided Yes   Education Details ther-ex, HEP, handout   Person(s) Educated Patient   Methods Explanation;Demonstration;Tactile cues;Verbal cues   Comprehension Returned demonstration;Verbalized understanding        Objectives  There-ex  Directed patient with standing bilateral shoulder extension to neutral resisting yellow band 10x3 with 5 seconds  Standing R shoulder adduction resisting red band 10x5 seconds for 2 sets to promote proper posture.   No R anterior lateral and anterior medial hip pain afterwards. Increased R low back/posterior hip pain afterwards to 3/10 which decreased to 2/10 after seated rest.   Supine R lower trunk rotation 10x5 seconds for 2 sets. R anterior medial hip discomfort which disappeared with resting in the neutral position.   No pain afterwards  Supine transversus abdominis contraction 10x5 seconds  Then with pelvic floor contraction 10x5 seconds for 2 sets  Then with hip fallouts 10x2 each LE. Pt demonstrates slight difficulty with L hip fallout with slight L pelvic rotation compensation. Improved with cues and practice   Improved exercise technique, movement at target joints, use of target muscles after mod verbal, visual, tactile cues.     Decreased R low back pain with exercises to promote lumbar mobility and increasing R intervertebral foraminal space. Decreased R anterior medial and anterior lateral hip pain with exercises that improve trunk muscle activation.  Pt demonstrates slight difficulty with L hip fallout exercise with slight L pelvic rotation compensation. Improved with cues and practice.       PT Long Term Goals - 02/16/16 1901      PT LONG TERM GOAL #1   Title Patient will have a decrease in low back and R  hip pain to 2/10 or less at most to promote sitting tolerance for work and travel.    Baseline 5/10 at worst   Time 6   Period Weeks   Status New     PT LONG TERM GOAL #2   Title Patient will improve her Modified Oswestry Low Back Pain Disability Questionnaire by at least 10% as a demonstration of improved function.    Baseline 24%   Time 6   Period Weeks   Status New     PT LONG TERM GOAL #3   Title Patient will report being able to sit for at least 1.5 hours with 2/10 or less low back and R hip pain at worst to promote ability to perform her work tasks.    Baseline Increased pain to 5/10 after sitting for an hour   Time 6   Period Weeks   Status New     PT LONG TERM GOAL #4   Title Patient will improve bilateral hip extension strength by at least 1/2 MMT grade to promote ability to perform standing tasks with less low back and R hip pain.    Time 6   Period Weeks   Status New               Plan - 02/24/16 1608    Clinical Impression Statement Decreased R low back pain with exercises to promote lumbar mobility and increasing R intervertebral foraminal space. Decreased R anterior medial and anterior lateral hip pain with exercises that improve trunk muscle activation.  Pt demonstrates slight difficulty with L hip fallout exercise with slight L pelvic rotation compensation. Improved with cues and practice.   Rehab Potential Good   Clinical Impairments Affecting Rehab Potential chronicity of condition   PT Frequency 2x / week   PT Duration 6 weeks   PT Treatment/Interventions Therapeutic exercise;Therapeutic activities;Manual techniques;Dry needling;Neuromuscular re-education;Patient/family education;Traction;Ultrasound;Electrical Stimulation;Aquatic Therapy   PT Next Visit Plan posture, trunk extension, hip strengthening, manual therapy, lumbar and thoracic mobility   Consulted and Agree with Plan of Care Patient      Patient will benefit from skilled therapeutic  intervention in order to improve the following deficits and impairments:  Pain, Decreased strength, Hypomobility  Visit Diagnosis: Pain in right hip  Muscle weakness (generalized)  Chronic right-sided low back pain with right-sided sciatica     Problem List Patient Active Problem List   Diagnosis Date Noted  . Annual physical exam 11/17/2015  . Low back pain 11/17/2015  . Allergic rhinitis 07/01/2015  . Anxiety 07/01/2015  . Body mass index (BMI) of 27.0-27.9 in adult 07/01/2015  . Blood pressure elevated 07/01/2015  . H/O disease 07/01/2015  .  Blood glucose elevated 07/01/2015  . Climacteric 07/01/2015  . Osteopenia 07/01/2015  . Hemorrhage, postmenopausal 07/01/2015  . Skin lesion 07/01/2015  . Insomnia 12/10/2014  . Muscle spasm 10/27/2014    Joneen Boers PT, DPT   02/24/2016, 4:51 PM  [facility] [address], [place], [address] Phone: [phone]   Fax:  [phone]  Name: TYNITA SULLENS MRN: [id number] Date of Birth: 23-Feb-1961

## 2016-02-24 NOTE — Patient Instructions (Addendum)
  Caudal Rotation: Hip Roll, Neutral Lordosis - Supine   Lie with knees bent, feet flat. Lower knees out to right side, rotating hips and trunk.  Hold for 5 seconds.  Return.  Repeat __10__ times per set. Do 1_ sets per session. 3 times daily.    Copyright  VHI. All rights reserved.    Pain free range.      Gave supine transversus abdominis with pelvic floor contraction as well as hip fallouts 10x3 with 5 seconds 3x daily for 5 days a week as part of her HEP. Discontinued seated manually resisted R hip flexion isometrics. Pt demonstrated and verbalized understanding.

## 2016-02-28 ENCOUNTER — Other Ambulatory Visit: Payer: Self-pay | Admitting: Family Medicine

## 2016-02-28 ENCOUNTER — Ambulatory Visit: Payer: 59

## 2016-02-28 DIAGNOSIS — M25551 Pain in right hip: Secondary | ICD-10-CM | POA: Diagnosis not present

## 2016-02-28 DIAGNOSIS — M5441 Lumbago with sciatica, right side: Secondary | ICD-10-CM

## 2016-02-28 DIAGNOSIS — G8929 Other chronic pain: Secondary | ICD-10-CM

## 2016-02-28 DIAGNOSIS — J309 Allergic rhinitis, unspecified: Secondary | ICD-10-CM

## 2016-02-28 DIAGNOSIS — M6281 Muscle weakness (generalized): Secondary | ICD-10-CM

## 2016-02-28 NOTE — Therapy (Signed)
[facility] [address] [place], [address] Phone: [phone]   Fax:  [phone]  Physical Therapy Treatment  Patient Details  Name: Rachel [facility] MRN: [id number] Date of Birth: [birth year] Referring Provider: Sharlet Salina, DO  Encounter Date: 02/28/2016      PT End of Session - 02/28/16 1517    Visit Number 4   Number of Visits 13   Date for PT Re-Evaluation 03/30/16   PT Start Time 1518   PT Stop Time 1603   PT Time Calculation (min) 45 min   Activity Tolerance Patient tolerated treatment well   Behavior During Therapy [facility] for tasks assessed/performed      Past Medical History:  Diagnosis Date  . Allergy    sinus problem    Past Surgical History:  Procedure Laterality Date  . DILATION AND CURETTAGE OF UTERUS    . ESSURE TUBAL LIGATION    . OTHER SURGICAL HISTORY     coil placement in tubes to prevent pregnancy-patient could not recall the name of the procedure  . TONSILLECTOMY      There were no vitals filed for this visit.      Subjective Assessment - 02/28/16 1519    Subjective This weekend has been pretty good until today. R low back and anterior medial hip pain is a 3/10 currently. Pt states that standing up from sitting might have caused her R anterior medial hip discomfort.    Pertinent History Back and R posterior hip pain. Has had pain for 2 years, sudden onset, unknown method of injury. 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 Went to Dr Sharlet Salina, had an MRI which revealed a little bit of arthritis. Everything else looked good. Denies bowel or bladder problems or saddle anesthesia. Sometimes R foot feels like it is going to fall asleep (R lateral plantar surface).  Pt also adds that she slipped and fell on ice while walking to work 2 years ago and landed on her knee (does not know which knee).     Patient Stated Goals Try to work around the pain. Try  to figure out what I am doing to aggravate it.    Currently in Pain? Yes   Pain Score 3    Pain Onset More than a month ago                                 PT Education - 02/28/16 1530    Education provided Yes   Education Details ther-ex, handout   Person(s) Educated Patient   Methods Explanation;Demonstration;Tactile cues;Verbal cues   Comprehension Returned demonstration;Verbalized understanding     Objectives  There-ex  Directed patient with sitting with lumbar support x 1 min. Recommended cylindrical lumbar support with memory foam to answer pt question.  Sitting anterior and posterior pelvic tilts on chair. 10x3  Sitting L thoracic rotation 10x5 seconds for 3 sets  Decreased R low back pain  Seated bilateral shoulder extension resisting green band 10x2  No change in anterior medial hip pain  Seated R hip extension isometrics 10x2 with 5 second holds    Standing R shoulder adduction resisting red band 10x5 seconds for 3 sets to promote proper posture. Decreased anterior medial R hip pain.  Reviewed and given as part of her HEP. Handout provided.   Standing bilateral shoulder extension resisting  red band 10x3 with 5 seconds   Supine hip fallouts 10x2 each LE  Forward step ups onto 3 inch step with R and L LE 10x2, one UE assist.  Static mini squats 10x2 with red band resisting hip abduction/ER    Improved exercise technique, movement at target joints, use of target muscles after min to mod verbal, visual, tactile cues.     Decreased lumbar paraspinal activation palpated with R shoulder adduction and bilateral shoulder extension exercises. Pt demonstrates tendency for L pelvic rotation with L hip fallouts. Cues needed to correct. Decreased R low back pain with lumbar mobility exercises, decreased R anterior medial hip pain with standing resisted shoulder exercises.            PT Long Term Goals - 02/16/16 1901      PT LONG TERM GOAL  #1   Title Patient will have a decrease in low back and R hip pain to 2/10 or less at most to promote sitting tolerance for work and travel.    Baseline 5/10 at worst   Time 6   Period Weeks   Status New     PT LONG TERM GOAL #2   Title Patient will improve her Modified Oswestry Low Back Pain Disability Questionnaire by at least 10% as a demonstration of improved function.    Baseline 24%   Time 6   Period Weeks   Status New     PT LONG TERM GOAL #3   Title Patient will report being able to sit for at least 1.5 hours with 2/10 or less low back and R hip pain at worst to promote ability to perform her work tasks.    Baseline Increased pain to 5/10 after sitting for an hour   Time 6   Period Weeks   Status New     PT LONG TERM GOAL #4   Title Patient will improve bilateral hip extension strength by at least 1/2 MMT grade to promote ability to perform standing tasks with less low back and R hip pain.    Time 6   Period Weeks   Status New               Plan - 02/28/16 1537    Clinical Impression Statement Decreased lumbar paraspinal activation palpated with R shoulder adduction and bilateral shoulder extension exercises. Pt demonstrates tendency for L pelvic rotation with L hip fallouts. Cues needed to correct. Decreased R low back pain with lumbar mobility exercises, decreased R anterior medial hip pain with standing resisted shoulder exercises.   Rehab Potential Good   Clinical Impairments Affecting Rehab Potential chronicity of condition   PT Frequency 2x / week   PT Duration 6 weeks   PT Treatment/Interventions Therapeutic exercise;Therapeutic activities;Manual techniques;Dry needling;Neuromuscular re-education;Patient/family education;Traction;Ultrasound;Electrical Stimulation;Aquatic Therapy   PT Next Visit Plan posture, trunk extension, hip strengthening, manual therapy, lumbar and thoracic mobility   Consulted and Agree with Plan of Care Patient      Patient will  benefit from skilled therapeutic intervention in order to improve the following deficits and impairments:  Pain, Decreased strength, Hypomobility  Visit Diagnosis: Pain in right hip  Muscle weakness (generalized)  Chronic right-sided low back pain with right-sided sciatica     Problem List Patient Active Problem List   Diagnosis Date Noted  . Annual physical exam 11/17/2015  . Low back pain 11/17/2015  . Allergic rhinitis 07/01/2015  . Anxiety 07/01/2015  . Body mass index (BMI) of 27.0-27.9 in adult  07/01/2015  . Blood pressure elevated 07/01/2015  . H/O disease 07/01/2015  . Blood glucose elevated 07/01/2015  . Climacteric 07/01/2015  . Osteopenia 07/01/2015  . Hemorrhage, postmenopausal 07/01/2015  . Skin lesion 07/01/2015  . Insomnia 12/10/2014  . Muscle spasm 10/27/2014    Joneen Boers PT, DPT   02/28/2016, 4:25 PM  [facility] [address] [place], [address] Phone: [phone]   Fax:  [phone]  Name: SOPHEA HAMAKER MRN: [id number] Date of Birth: 1960/12/02

## 2016-02-28 NOTE — Patient Instructions (Addendum)
     Hold tubing in right hand, arm out. Pull arm to your side. Do not twist or rotate trunk.  Hold for 5 seconds. Repeat ___10_ times per set. Do 3___ sets per session. Do _1___ sessions per day.  http://orth.exer.us/834   Copyright  VHI. All rights reserved.

## 2016-03-07 ENCOUNTER — Ambulatory Visit: Payer: 59

## 2016-03-07 DIAGNOSIS — G8929 Other chronic pain: Secondary | ICD-10-CM

## 2016-03-07 DIAGNOSIS — M5441 Lumbago with sciatica, right side: Secondary | ICD-10-CM

## 2016-03-07 DIAGNOSIS — M25551 Pain in right hip: Secondary | ICD-10-CM

## 2016-03-07 DIAGNOSIS — M6281 Muscle weakness (generalized): Secondary | ICD-10-CM

## 2016-03-07 NOTE — Therapy (Signed)
[facility] [address] [place], [address] Phone: [phone]   Fax:  [phone]  Physical Therapy Treatment  Patient Details  Name: Rachel Ali MRN: [id number] Date of Birth: 1960/06/04 Referring Provider: Sharlet Salina, DO  Encounter Date: 03/07/2016      PT End of Session - 03/07/16 [id number]    Visit Number 5   Number of Visits 13   Date for PT Re-Evaluation 03/30/16   PT Start Time [id number]   PT Stop Time 1836   PT Time Calculation (min) 47 min   Activity Tolerance Patient tolerated treatment well   Behavior During Therapy [facility] for tasks assessed/performed      Past Medical History:  Diagnosis Date  . Allergy    sinus problem    Past Surgical History:  Procedure Laterality Date  . DILATION AND CURETTAGE OF UTERUS    . ESSURE TUBAL LIGATION    . OTHER SURGICAL HISTORY     coil placement in tubes to prevent pregnancy-patient could not recall the name of the procedure  . TONSILLECTOMY      There were no vitals filed for this visit.      Subjective Assessment - 03/07/16 1750    Subjective R anterior medial hip is pretty good. No pain there currently (1/10 R anterior medial hip pain at most for the past week). Feels R lateral hip pain (3/10 currently). No R posterior hip/low back pain currently (1/10 at most for the past week).     Pertinent History Back and R posterior hip pain. Has had pain for 2 years, sudden onset, unknown method of injury. 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 Went to Dr Sharlet Salina, had an MRI which revealed a little bit of arthritis. Everything else looked good. Denies bowel or bladder problems or saddle anesthesia. Sometimes R foot feels like it is going to fall asleep (R lateral plantar surface).  Pt also adds that she slipped and fell on ice while walking to work 2 years ago and landed on her knee (does not know which knee).     Patient Stated  Goals Try to work around the pain. Try to figure out what I am doing to aggravate it.    Currently in Pain? Yes   Pain Score 3   R lateral hip   Pain Onset More than a month ago            [facility] PT Assessment - 03/07/16 1757      Strength   Right Hip ABduction 5/5                             PT Education - 03/07/16 1803    Education provided Yes   Education Details ther-ex   Person(s) Educated Patient   Methods Explanation;Demonstration;Tactile cues;Verbal cues   Comprehension Returned demonstration;Verbalized understanding        Objectives  There-ex  TTP R lateral hip at greater trochanter  Directed patient with supine R hip abduction isometrics using gait belt (knees supported with bolster) 1 min x 5 with 1 minute rest break. 50% effort. Hips in neutral position for L hip abductor muscle slack.   Reviewed aforementioned exercise to as part of her HEP to be performed 3x daily (1 min x 5 with 1 min rest breaks). Pt demonstrated and verbalized understanding.   No R  lateral hip pain afterwards but slight increase in R posterior hip/low back discomfort which eases with rest  Supine hip fall outs 10x each LE. Improved pelvic control compared to previous sessions.   Supine alternating leg extension in hooklying position 5x2 each LE. Good control at first but lumbopelvic and thigh control decreases with increased repetition. Increased time spent for quality of movement.     Forward step ups onto 3 inch step with R and L LE 10x2, one UE assist. Cues for femoral control.   Side step 32 ft to the L and 32 ft to the R for glute med muscle use, making sure hip abductor muscles are in slack. Bilateral hip soreness afterwards.   Improved exercise technique, movement at target joints, use of target muscles after min to mod verbal, visual, tactile cues.      Patient making very good progress with R low back/posterior hip pain, R anterior medial hip pain  levels. Demonstrates R lateral hip pain however, with reproduction of symptoms with palpation at her greater trochanter. Performed supine hip abduction isometric exercises with muscle in slack to help address the issue.  Improved lumbopelvic control with hip fallouts compared to previous sessions.                       PT Long Term Goals - 02/16/16 1901      PT LONG TERM GOAL #1   Title Patient will have a decrease in low back and R hip pain to 2/10 or less at most to promote sitting tolerance for work and travel.    Baseline 5/10 at worst   Time 6   Period Weeks   Status New     PT LONG TERM GOAL #2   Title Patient will improve her Modified Oswestry Low Back Pain Disability Questionnaire by at least 10% as a demonstration of improved function.    Baseline 24%   Time 6   Period Weeks   Status New     PT LONG TERM GOAL #3   Title Patient will report being able to sit for at least 1.5 hours with 2/10 or less low back and R hip pain at worst to promote ability to perform her work tasks.    Baseline Increased pain to 5/10 after sitting for an hour   Time 6   Period Weeks   Status New     PT LONG TERM GOAL #4   Title Patient will improve bilateral hip extension strength by at least 1/2 MMT grade to promote ability to perform standing tasks with less low back and R hip pain.    Time 6   Period Weeks   Status New               Plan - 03/07/16 1803    Clinical Impression Statement Patient making very good progress with R low back/posterior hip pain, R anterior medial hip pain levels. Demonstrates R lateral hip pain however, with reproduction of symptoms with palpation at her greater trochanter. Performed supine hip abduction isometric exercises with muscle in slack to help address the issue.  Improved lumbopelvic control with hip fallouts compared to previous sessions.    Rehab Potential Good   Clinical Impairments Affecting Rehab Potential chronicity of condition   PT  Frequency 2x / week   PT Duration 6 weeks   PT Treatment/Interventions Therapeutic exercise;Therapeutic activities;Manual techniques;Dry needling;Neuromuscular re-education;Patient/family education;Traction;Ultrasound;Electrical Stimulation;Aquatic Therapy   PT Next Visit Plan posture, trunk extension,  hip strengthening, manual therapy, lumbar and thoracic mobility   Consulted and Agree with Plan of Care Patient      Patient will benefit from skilled therapeutic intervention in order to improve the following deficits and impairments:  Pain, Decreased strength, Hypomobility  Visit Diagnosis: Pain in right hip  Muscle weakness (generalized)  Chronic right-sided low back pain with right-sided sciatica     Problem List Patient Active Problem List   Diagnosis Date Noted  . Annual physical exam 11/17/2015  . Low back pain 11/17/2015  . Allergic rhinitis 07/01/2015  . Anxiety 07/01/2015  . Body mass index (BMI) of 27.0-27.9 in adult 07/01/2015  . Blood pressure elevated 07/01/2015  . H/O disease 07/01/2015  . Blood glucose elevated 07/01/2015  . Climacteric 07/01/2015  . Osteopenia 07/01/2015  . Hemorrhage, postmenopausal 07/01/2015  . Skin lesion 07/01/2015  . Insomnia 12/10/2014  . Muscle spasm 10/27/2014    Joneen Boers PT, DPT   03/07/2016, 6:50 PM  [facility] [address] [place], [address] Phone: [phone]   Fax:  [phone]  Name: Rachel Ali MRN: [id number] Date of Birth: 02-Mar-1961

## 2016-03-07 NOTE — Patient Instructions (Signed)
   On your back and belt around thighs right above the knees and shoulder width apart:   Press right thigh against belt 50 % effort for 1 minute.     Rest for 1 minute.    Repeat 5 times.    Perform 3 sets daily.

## 2016-03-09 ENCOUNTER — Ambulatory Visit: Payer: 59

## 2016-03-09 DIAGNOSIS — G8929 Other chronic pain: Secondary | ICD-10-CM

## 2016-03-09 DIAGNOSIS — M6281 Muscle weakness (generalized): Secondary | ICD-10-CM

## 2016-03-09 DIAGNOSIS — M25551 Pain in right hip: Secondary | ICD-10-CM | POA: Diagnosis not present

## 2016-03-09 DIAGNOSIS — M5441 Lumbago with sciatica, right side: Secondary | ICD-10-CM

## 2016-03-09 NOTE — Therapy (Signed)
[facility] [address], [place], [address] Phone: [phone]   Fax:  [phone]  Physical Therapy Treatment  Patient Details  Name: Rachel Ali MRN: [id number] Date of Birth: [birth year] Referring Provider: Sharlet Salina, DO  Encounter Date: 03/09/2016      PT End of Session - 03/09/16 1749    Visit Number 6   Number of Visits 13   Date for PT Re-Evaluation 03/30/16   PT Start Time [id number]   PT Stop Time 1836   PT Time Calculation (min) 47 min   Activity Tolerance Patient tolerated treatment well   Behavior During Therapy [facility] for tasks assessed/performed      Past Medical History:  Diagnosis Date  . Allergy    sinus problem    Past Surgical History:  Procedure Laterality Date  . DILATION AND CURETTAGE OF UTERUS    . ESSURE TUBAL LIGATION    . OTHER SURGICAL HISTORY     coil placement in tubes to prevent pregnancy-patient could not recall the name of the procedure  . TONSILLECTOMY      There were no vitals filed for this visit.      Subjective Assessment - 03/09/16 1751    Subjective The last exercise with the strap (supine R hip abduction isometrics) bothered her R lateral hip. Currently feels R low back/posterior hip and posterior R thigh 3/10 discomfort.  Not really having pain R lateral hip.    Pertinent History Back and R posterior hip pain. Has had pain for 2 years, sudden onset, unknown method of injury. 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 Went to Dr Sharlet Salina, had an MRI which revealed a little bit of arthritis. Everything else looked good. Denies bowel or bladder problems or saddle anesthesia. Sometimes R foot feels like it is going to fall asleep (R lateral plantar surface).  Pt also adds that she slipped and fell on ice while walking to work 2 years ago and landed on her knee (does not know which knee).     Patient Stated Goals Try to work around  the pain. Try to figure out what I am doing to aggravate it.    Currently in Pain? Yes   Pain Score 3   3/10 R low back/posterior hip and posterior thigh discomfort   Pain Onset More than a month ago                                 PT Education - 03/09/16 1816    Education provided Yes   Education Details ther-ex   [employer]) Educated Patient   Methods Explanation;Demonstration;Tactile cues;Verbal cues   Comprehension Returned demonstration;Verbalized understanding        Objectives  There-ex  Pt was recommended to hold off on the supine R hip abduction isometric exercise HEP. Pt verbalized understanding.   Directed patient with seated L trunk rotation 10x5 seconds. Increased R low back pulling sensation, decreased R posterior thigh symptoms   Supine R lower trunk rotation 10x5 seconds   Sitting with proper posture and core activation: gentle manual perturbation from PT 4x  Supine hip fallouts 10x2 each LE with transversus abdominis and pelvic floor contraction   Sitting up straight with proper posture and core activation x 1 minute  Slight decrease in R posterior hip symptoms  Sitting with lumbar towel roll  at R low back only x 2 min    No R posterior hip pain afterwards    Sit <> stand from regular chair with arms 10x with emphasis on femoral control to help decrease R lateral hip symptoms.    Forward step ups onto 3 inch step with R and L LE 10x, one UE assist. Cues for femoral control including use of mirror for visual cues.    Improved exercise technique, movement at target joints, use of target muscles after min to mod verbal, visual, tactile cues.      Manual therapy  Prone UPA to R L5 transverse process grade 3. Decreased R low back discomfort    Decreased symptoms with manual therapy to R lumbar transverse process to promote mobility, and exercises to promote trunk mobility, gentle R lumbar extension (sitting with towel roll  to R side) and core activation. Worked on femoral control with closed chain activities to help decrease R lateral hip pain. Slight R low back discomfort but no R posterior hip and thigh symptoms after session.             PT Long Term Goals - 02/16/16 1901      PT LONG TERM GOAL #1   Title Patient will have a decrease in low back and R hip pain to 2/10 or less at most to promote sitting tolerance for work and travel.    Baseline 5/10 at worst   Time 6   Period Weeks   Status New     PT LONG TERM GOAL #2   Title Patient will improve her Modified Oswestry Low Back Pain Disability Questionnaire by at least 10% as a demonstration of improved function.    Baseline 24%   Time 6   Period Weeks   Status New     PT LONG TERM GOAL #3   Title Patient will report being able to sit for at least 1.5 hours with 2/10 or less low back and R hip pain at worst to promote ability to perform her work tasks.    Baseline Increased pain to 5/10 after sitting for an hour   Time 6   Period Weeks   Status New     PT LONG TERM GOAL #4   Title Patient will improve bilateral hip extension strength by at least 1/2 MMT grade to promote ability to perform standing tasks with less low back and R hip pain.    Time 6   Period Weeks   Status New               Plan - 03/09/16 1816    Clinical Impression Statement Decreased symptoms with manual therapy to R lumbar transverse process to promote mobility, and exercises to promote trunk mobility, gentle R lumbar extension (sitting with towel roll to R side) and core activation. Worked on femoral control with closed chain activities to help decrease R lateral hip pain. Slight R low back discomfort but no R posterior hip and thigh symptoms after session.    Rehab Potential Good   Clinical Impairments Affecting Rehab Potential chronicity of condition   PT Frequency 2x / week   PT Duration 6 weeks   PT Treatment/Interventions Therapeutic exercise;Therapeutic  activities;Manual techniques;Dry needling;Neuromuscular re-education;Patient/family education;Traction;Ultrasound;Electrical Stimulation;Aquatic Therapy   PT Next Visit Plan posture, trunk extension, hip strengthening, manual therapy, lumbar and thoracic mobility   Consulted and Agree with Plan of Care Patient      Patient will benefit from skilled therapeutic intervention  in order to improve the following deficits and impairments:  Pain, Decreased strength, Hypomobility  Visit Diagnosis: Pain in right hip  Muscle weakness (generalized)  Chronic right-sided low back pain with right-sided sciatica     Problem List Patient Active Problem List   Diagnosis Date Noted  . Annual physical exam 11/17/2015  . Low back pain 11/17/2015  . Allergic rhinitis 07/01/2015  . Anxiety 07/01/2015  . Body mass index (BMI) of 27.0-27.9 in adult 07/01/2015  . Blood pressure elevated 07/01/2015  . H/O disease 07/01/2015  . Blood glucose elevated 07/01/2015  . Climacteric 07/01/2015  . Osteopenia 07/01/2015  . Hemorrhage, postmenopausal 07/01/2015  . Skin lesion 07/01/2015  . Insomnia 12/10/2014  . Muscle spasm 10/27/2014    Joneen Boers PT, DPT   03/09/2016, 6:59 PM  [facility] [address], [place], [address] Phone: [phone]   Fax:  [phone]  Name: SHELIAH RICHES MRN: [id number] Date of Birth: [birth year]

## 2016-03-09 NOTE — Patient Instructions (Addendum)
Pt was recommended to hold off on the supine R hip abduction isometric exercise HEP. Pt verbalized understanding.    Pt was also recommended to use lumbar towel roll for R side only when sitting to see if it helps her more. Pt demonstrated and verbalized understanding.

## 2016-03-14 ENCOUNTER — Ambulatory Visit: Payer: 59

## 2016-03-14 DIAGNOSIS — M25551 Pain in right hip: Secondary | ICD-10-CM

## 2016-03-14 DIAGNOSIS — M6281 Muscle weakness (generalized): Secondary | ICD-10-CM

## 2016-03-14 DIAGNOSIS — M5441 Lumbago with sciatica, right side: Secondary | ICD-10-CM

## 2016-03-14 DIAGNOSIS — G8929 Other chronic pain: Secondary | ICD-10-CM

## 2016-03-14 NOTE — Therapy (Signed)
[place] [address] [place], [address] Phone: [phone]   Fax:  [phone]  Physical Therapy Treatment  Patient Details  Name: Rachel Ali MRN: [id number] Date of Birth: 1961-02-18 Referring Provider: Sharlet Salina, DO  Encounter Date: 03/14/2016      PT End of Session - 03/14/16 1751    Visit Number 7   Number of Visits 13   Date for PT Re-Evaluation 03/30/16   PT Start Time [id number]   PT Stop Time 1834   PT Time Calculation (min) 43 min   Activity Tolerance Patient tolerated treatment well   Behavior During Therapy [facility] for tasks assessed/performed      Past Medical History:  Diagnosis Date  . Allergy    sinus problem    Past Surgical History:  Procedure Laterality Date  . DILATION AND CURETTAGE OF UTERUS    . ESSURE TUBAL LIGATION    . OTHER SURGICAL HISTORY     coil placement in tubes to prevent pregnancy-patient could not recall the name of the procedure  . TONSILLECTOMY      There were no vitals filed for this visit.      Subjective Assessment - 03/14/16 1752    Subjective R low back and R hip is doing a lot better than it used to be. The only place it is hurting now is at the R low back and R lateral mid thigh.  1-2/10 both areas current and at most for the past 7 days.  Does not feel R hip grinding anymore.  Able to sit for about 1.5 hours to 2 hours without back bothering her.  No back pain, just gets tired of being in the sitting position.   Pertinent History Back and R posterior hip pain. Has had pain for 2 years, sudden onset, unknown method of injury. 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 Went to Dr Sharlet Salina, had an MRI which revealed a little bit of arthritis. Everything else looked good. Denies bowel or bladder problems or saddle anesthesia. Sometimes R foot feels like it is going to fall asleep (R lateral plantar surface).  Pt also adds that  she slipped and fell on ice while walking to work 2 years ago and landed on her knee (does not know which knee).     Patient Stated Goals Try to work around the pain. Try to figure out what I am doing to aggravate it.    Currently in Pain? Yes   Pain Score 2    Pain Onset More than a month ago                                 PT Education - 03/14/16 1758    Education provided Yes   Education Details ther-ex   [employer]) Educated Patient   Methods Explanation;Demonstration;Tactile cues;Verbal cues   Comprehension Returned demonstration;Verbalized understanding        Objectives  There-ex     Directed patient with seated R trunk rotation 10x5 seconds for 2 sets.    No symptoms afterwards.   Sitting up straight with proper posture and core activation with manual perturbation from PT 3 x 1 minute   Forward step ups onto 3 inch step with R and L LE 10x, one UE assist.  Then with one riser 10x each LE with one UE  assist  Yellow T-band side step 32 ft to the R and 32 ft to the L   Yellow T-band wedding march 22 ft.   Supine bridge 10x5 seconds for 3 sets  SLS on R LE with emphasis on neutral pelvis 10x5 seconds for 3 sets  standing alternating toe taps onto treadmill platform with emphasis on neutral pelvis 10x3 each LE    Improved exercise technique, movement at target joints, use of target muscles after min to mod verbal, visual, tactile cues.   Decreased R low back pain with seated R trunk rotation exercise to promote mobility to promote opening to R low back facet joints. Improved bilateral LE femoral control with exercises.  Pt now able to tolerate sitting for at least 2 hours without back pain. Patient making very good progress towards goals         PT Long Term Goals - 02/16/16 1901      PT LONG TERM GOAL #1   Title Patient will have a decrease in low back and R hip pain to 2/10 or less at most to promote sitting tolerance for  work and travel.    Baseline 5/10 at worst   Time 6   Period Weeks   Status New     PT LONG TERM GOAL #2   Title Patient will improve her Modified Oswestry Low Back Pain Disability Questionnaire by at least 10% as a demonstration of improved function.    Baseline 24%   Time 6   Period Weeks   Status New     PT LONG TERM GOAL #3   Title Patient will report being able to sit for at least 1.5 hours with 2/10 or less low back and R hip pain at worst to promote ability to perform her work tasks.    Baseline Increased pain to 5/10 after sitting for an hour   Time 6   Period Weeks   Status New     PT LONG TERM GOAL #4   Title Patient will improve bilateral hip extension strength by at least 1/2 MMT grade to promote ability to perform standing tasks with less low back and R hip pain.    Time 6   Period Weeks   Status New               Plan - 03/14/16 1758    Clinical Impression Statement Decreased R low back pain with seated R trunk rotation exercise to promote mobility to promote opening to R low back facet joints. Improved bilateral LE femoral control with exercises.  Pt now able to tolerate sitting for at least 2 hours without back pain. Patient making very good progress towards goals    Rehab Potential Good   Clinical Impairments Affecting Rehab Potential chronicity of condition   PT Frequency 2x / week   PT Duration 6 weeks   PT Treatment/Interventions Therapeutic exercise;Therapeutic activities;Manual techniques;Dry needling;Neuromuscular re-education;Patient/family education;Traction;Ultrasound;Electrical Stimulation;Aquatic Therapy   PT Next Visit Plan posture, trunk extension, hip strengthening, manual therapy, lumbar and thoracic mobility   Consulted and Agree with Plan of Care Patient      Patient will benefit from skilled therapeutic intervention in order to improve the following deficits and impairments:  Pain, Decreased strength, Hypomobility  Visit  Diagnosis: Pain in right hip  Muscle weakness (generalized)  Chronic right-sided low back pain with right-sided sciatica     Problem List Patient Active Problem List   Diagnosis Date Noted  . Annual physical exam 11/17/2015  .  Low back pain 11/17/2015  . Allergic rhinitis 07/01/2015  . Anxiety 07/01/2015  . Body mass index (BMI) of 27.0-27.9 in adult 07/01/2015  . Blood pressure elevated 07/01/2015  . H/O disease 07/01/2015  . Blood glucose elevated 07/01/2015  . Climacteric 07/01/2015  . Osteopenia 07/01/2015  . Hemorrhage, postmenopausal 07/01/2015  . Skin lesion 07/01/2015  . Insomnia 12/10/2014  . Muscle spasm 10/27/2014   Joneen Boers PT, DPT   03/14/2016, 6:54 PM  [facility] [address], [place], [address] Phone: [phone]   Fax:  [phone]  Name: Rachel Ali MRN: [id number] Date of Birth: 1961/01/07

## 2016-03-14 NOTE — Patient Instructions (Signed)
  Sitting on a chair with upright posture, rotate trunk to the right side.  Hold for 5 seconds.   Repeat 10 times.   Perform 2-3 sets daily.

## 2016-03-18 ENCOUNTER — Ambulatory Visit (INDEPENDENT_AMBULATORY_CARE_PROVIDER_SITE_OTHER): Payer: 59

## 2016-03-18 DIAGNOSIS — Z23 Encounter for immunization: Secondary | ICD-10-CM

## 2016-03-21 ENCOUNTER — Ambulatory Visit: Payer: 59

## 2016-03-21 DIAGNOSIS — G8929 Other chronic pain: Secondary | ICD-10-CM

## 2016-03-21 DIAGNOSIS — M25551 Pain in right hip: Secondary | ICD-10-CM

## 2016-03-21 DIAGNOSIS — M6281 Muscle weakness (generalized): Secondary | ICD-10-CM

## 2016-03-21 DIAGNOSIS — M5441 Lumbago with sciatica, right side: Principal | ICD-10-CM

## 2016-03-21 NOTE — Therapy (Signed)
[place] [address], [place], [address] Phone: [phone]   Fax:  [phone]  Physical Therapy Treatment  Patient Details  Name: Rachel Ali MRN: [id number] Date of Birth: [birth year] Referring Provider: Sharlet Salina, DO  Encounter Date: 03/21/2016      PT End of Session - 03/21/16 1750    Visit Number 8   Number of Visits 13   Date for PT Re-Evaluation 03/30/16   PT Start Time 1750   PT Stop Time 1835   PT Time Calculation (min) 45 min   Activity Tolerance Patient tolerated treatment well   Behavior During Therapy [facility] for tasks assessed/performed      Past Medical History:  Diagnosis Date  . Allergy    sinus problem    Past Surgical History:  Procedure Laterality Date  . DILATION AND CURETTAGE OF UTERUS    . ESSURE TUBAL LIGATION    . OTHER SURGICAL HISTORY     coil placement in tubes to prevent pregnancy-patient could not recall the name of the procedure  . TONSILLECTOMY      There were no vitals filed for this visit.      Subjective Assessment - 03/21/16 1752    Subjective Back is doing good until today. Symptoms increased after last session. Took 2 days to recover. Does not know of any exercises in particular that increased her symptoms.    Pertinent History Back and R posterior hip pain. Has had pain for 2 years, sudden onset, unknown method of injury. 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 Went to Dr Sharlet Salina, had an MRI which revealed a little bit of arthritis. Everything else looked good. Denies bowel or bladder problems or saddle anesthesia. Sometimes R foot feels like it is going to fall asleep (R lateral plantar surface).  Pt also adds that she slipped and fell on ice while walking to work 2 years ago and landed on her knee (does not know which knee).     Patient Stated Goals Try to work around the pain. Try to figure out what I am doing to  aggravate it.    Currently in Pain? Yes   Pain Score 2   R low back pain   Pain Onset More than a month ago            [facility] PT Assessment - 03/21/16 1843      Observation/Other Assessments   Modified Oswertry 14%; 10% improvement since initial evaluation.                              PT Education - 03/21/16 1820    Education provided Yes   Education Details ther-ex   [employer]) Educated Patient   Methods Explanation;Demonstration;Tactile cues;Verbal cues   Comprehension Returned demonstration;Verbalized understanding        Objectives  There-ex  Prone on elbows x 2 minutes. Slight decrease in symptoms Prone press ups 10x2 with 5 seconds. Increased low back soreness.  Sitting up straight with proper posture, and gentle manual resistance to resist forward flexion to activate abdominal muscles 10x3 with 5 second holds Standing bilateral shoulder extension resisting red band 10x5 seconds with scapular retraction for 1 set, then with yellow band 10x2 with 5 seconds   Seated R trunk rotation 10x10 seconds. Decreased R low back pain.  Then 3x10 seconds. Increased R lateral  low back discomfort which eased with rest.   Sitting with proper posture. Decreased back pain  Sitting chin tucks with scapular retraction 10x5 seconds   Improved exercise technique, movement at target joints, use of target muscles after min to mod verbal, visual, tactile cues.      Decreased R low back symptoms with gentle extension and  mobility exercises but symptoms increase if too much is performed. Pt demonstrates overall improved function as demonstrated by improved Modified Oswestry Low Back Pain Disability Questionnaire score compared to initial evaluation score.         PT Long Term Goals - 02/16/16 1901      PT LONG TERM GOAL #1   Title Patient will have a decrease in low back and R hip pain to 2/10 or less at most to promote sitting tolerance for work and  travel.    Baseline 5/10 at worst   Time 6   Period Weeks   Status New     PT LONG TERM GOAL #2   Title Patient will improve her Modified Oswestry Low Back Pain Disability Questionnaire by at least 10% as a demonstration of improved function.    Baseline 24%   Time 6   Period Weeks   Status New     PT LONG TERM GOAL #3   Title Patient will report being able to sit for at least 1.5 hours with 2/10 or less low back and R hip pain at worst to promote ability to perform her work tasks.    Baseline Increased pain to 5/10 after sitting for an hour   Time 6   Period Weeks   Status New     PT LONG TERM GOAL #4   Title Patient will improve bilateral hip extension strength by at least 1/2 MMT grade to promote ability to perform standing tasks with less low back and R hip pain.    Time 6   Period Weeks   Status New               Plan - 03/21/16 1820    Clinical Impression Statement Decreased R low back symptoms with gentle extension and  mobility exercises but symptoms increase if too much is performed. Pt demonstrates overall improved function as demonstrated by improved Modified Oswestry Low Back Pain Disability Questionnaire score compared to initial evaluation score.    Rehab Potential Good   Clinical Impairments Affecting Rehab Potential chronicity of condition   PT Frequency 2x / week   PT Duration 6 weeks   PT Treatment/Interventions Therapeutic exercise;Therapeutic activities;Manual techniques;Dry needling;Neuromuscular re-education;Patient/family education;Traction;Ultrasound;Electrical Stimulation;Aquatic Therapy   PT Next Visit Plan posture, trunk extension, hip strengthening, manual therapy, lumbar and thoracic mobility   Consulted and Agree with Plan of Care Patient      Patient will benefit from skilled therapeutic intervention in order to improve the following deficits and impairments:  Pain, Decreased strength, Hypomobility  Visit Diagnosis: Chronic  right-sided low back pain with right-sided sciatica  Pain in right hip  Muscle weakness (generalized)     Problem List Patient Active Problem List   Diagnosis Date Noted  . Annual physical exam 11/17/2015  . Low back pain 11/17/2015  . Allergic rhinitis 07/01/2015  . Anxiety 07/01/2015  . Body mass index (BMI) of 27.0-27.9 in adult 07/01/2015  . Blood pressure elevated 07/01/2015  . H/O disease 07/01/2015  . Blood glucose elevated 07/01/2015  . Climacteric 07/01/2015  . Osteopenia 07/01/2015  . Hemorrhage, postmenopausal 07/01/2015  .  Skin lesion 07/01/2015  . Insomnia 12/10/2014  . Muscle spasm 10/27/2014    Joneen Boers PT, DPT   03/21/2016, 6:56 PM  [place] [facility] [address], [place], [address] Phone: [phone]   Fax:  [phone]  Name: Rachel Ali MRN: [id number] Date of Birth: 14-Aug-1960

## 2016-03-23 ENCOUNTER — Ambulatory Visit: Payer: 59 | Attending: Physical Medicine and Rehabilitation

## 2016-03-23 DIAGNOSIS — G8929 Other chronic pain: Secondary | ICD-10-CM | POA: Insufficient documentation

## 2016-03-23 DIAGNOSIS — M6281 Muscle weakness (generalized): Secondary | ICD-10-CM | POA: Diagnosis present

## 2016-03-23 DIAGNOSIS — M5441 Lumbago with sciatica, right side: Secondary | ICD-10-CM | POA: Insufficient documentation

## 2016-03-23 DIAGNOSIS — M25551 Pain in right hip: Secondary | ICD-10-CM

## 2016-03-23 NOTE — Therapy (Signed)
[place] [facility] [address], [place], [address] Phone: [phone]   Fax:  [phone]  Physical Therapy Treatment  Patient Details  Name: Rachel Ali MRN: [id number] Date of Birth: 1961-01-04 Referring Provider: Sharlet Salina, DO  Encounter Date: 03/23/2016      PT End of Session - 03/23/16 1746    Visit Number 9   Number of Visits 13   Date for PT Re-Evaluation 03/30/16   PT Start Time [id number]   PT Stop Time 1834   PT Time Calculation (min) 48 min   Activity Tolerance Patient tolerated treatment well   Behavior During Therapy [facility] for tasks assessed/performed      Past Medical History:  Diagnosis Date  . Allergy    sinus problem    Past Surgical History:  Procedure Laterality Date  . DILATION AND CURETTAGE OF UTERUS    . ESSURE TUBAL LIGATION    . OTHER SURGICAL HISTORY     coil placement in tubes to prevent pregnancy-patient could not recall the name of the procedure  . TONSILLECTOMY      There were no vitals filed for this visit.      Subjective Assessment - 03/23/16 1749    Subjective Woke up this mornign and her R low back and R Lateral hip bothered her. Carried a Designer, television/film set the other day which might played a factor.      Pertinent History Back and R posterior hip pain. Has had pain for 2 years, sudden onset, unknown method of injury. 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 Went to Dr Sharlet Salina, had an MRI which revealed a little bit of arthritis. Everything else looked good. Denies bowel or bladder problems or saddle anesthesia. Sometimes R foot feels like it is going to fall asleep (R lateral plantar surface).  Pt also adds that she slipped and fell on ice while walking to work 2 years ago and landed on her knee (does not know which knee).     Patient Stated Goals Try to work around the pain. Try to figure out what I am doing to aggravate it.    Currently in Pain? Yes   Pain Score 2   R low back/posterior hip pain   Pain Onset More than a month ago                                 PT Education - 03/23/16 1823    Education provided Yes   Education Details ther-ex, HEP   Person(s) Educated Patient   Methods Explanation;Demonstration;Tactile cues;Verbal cues;Handout   Comprehension Verbalized understanding;Returned demonstration        Objectives  There-ex  Standing lumbar: R side bend (decreased symptoms)  L side bend (increased symptoms)  flexion (increased low back symptoms with R posterior LE discomfort)   Extension (no symptoms)     R lumbar extension (R low back pressure)  L lumbar extension (no symptoms)   Standing L lumbar extension 10x5 seconds for 2 sets Standing R lumbar side bending 10x5 seconds for 2 sets Standing back extension 10x5 seconds  Ergonomic squats 10x. No back pain. Slight R anterior lateral hip discomfort which eased with rest.   Backwards walk resisting double black tubing 10x2 (tube at thoracic area to promote trunk extension strength)  Standing bilateral shoulder extension resisting yellow band  10x10 seconds   Sitting up straight with proper posture  Decreased anterior medial hip symptoms (L1/L2 dermatome)   Improved exercise technique, movement at target joints, use of target muscles after min to mod verbal, visual, tactile cues.    Signs and symptoms suggests disc involvement with R low back and posterior hip symptoms and anterior medial hip (L1/L2 dermatome) today. Decreased pain with extension related activities and R side bending.          PT Long Term Goals - 02/16/16 1901      PT LONG TERM GOAL #1   Title Patient will have a decrease in low back and R hip pain to 2/10 or less at most to promote sitting tolerance for work and travel.    Baseline 5/10 at worst   Time 6   Period Weeks   Status New     PT LONG TERM GOAL #2   Title Patient  will improve her Modified Oswestry Low Back Pain Disability Questionnaire by at least 10% as a demonstration of improved function.    Baseline 24%   Time 6   Period Weeks   Status New     PT LONG TERM GOAL #3   Title Patient will report being able to sit for at least 1.5 hours with 2/10 or less low back and R hip pain at worst to promote ability to perform her work tasks.    Baseline Increased pain to 5/10 after sitting for an hour   Time 6   Period Weeks   Status New     PT LONG TERM GOAL #4   Title Patient will improve bilateral hip extension strength by at least 1/2 MMT grade to promote ability to perform standing tasks with less low back and R hip pain.    Time 6   Period Weeks   Status New               Plan - 03/23/16 1745    Clinical Impression Statement Signs and symptoms suggests disc involvement with R low back and posterior hip symptoms and anterior medial hip (L1/L2 dermatome) today. Decreased pain with extension related activities and R side bending.    Rehab Potential Good   Clinical Impairments Affecting Rehab Potential chronicity of condition   PT Frequency 2x / week   PT Duration 6 weeks   PT Treatment/Interventions Therapeutic exercise;Therapeutic activities;Manual techniques;Dry needling;Neuromuscular re-education;Patient/family education;Traction;Ultrasound;Electrical Stimulation;Aquatic Therapy   PT Next Visit Plan posture, trunk extension, hip strengthening, manual therapy, lumbar and thoracic mobility   Consulted and Agree with Plan of Care Patient      Patient will benefit from skilled therapeutic intervention in order to improve the following deficits and impairments:  Pain, Decreased strength, Hypomobility  Visit Diagnosis: Chronic right-sided low back pain with right-sided sciatica  Pain in right hip  Muscle weakness (generalized)     Problem List Patient Active Problem List   Diagnosis Date Noted  . Annual physical exam 11/17/2015  .  Low back pain 11/17/2015  . Allergic rhinitis 07/01/2015  . Anxiety 07/01/2015  . Body mass index (BMI) of 27.0-27.9 in adult 07/01/2015  . Blood pressure elevated 07/01/2015  . H/O disease 07/01/2015  . Blood glucose elevated 07/01/2015  . Climacteric 07/01/2015  . Osteopenia 07/01/2015  . Hemorrhage, postmenopausal 07/01/2015  . Skin lesion 07/01/2015  . Insomnia 12/10/2014  . Muscle spasm 10/27/2014    Joneen Boers PT, DPT   03/23/2016, 6:42 PM  [facility] [address] [place], [address] Phone: [phone]   Fax:  [phone]  Name: MAUREENA GAROFOLO MRN: [id number] Date of Birth: Jan 19, 1961

## 2016-03-23 NOTE — Patient Instructions (Addendum)
Side Bend    Stand with feet shoulder width apart, arms at sides. Bend to the right side, reaching down toward knee with hand. Hold for 5 seconds  Repeat sequence __10__ times per session. Do _3___ sets daily.     Copyright  VHI. All rights reserved.    Standing:    Gently lean back to the left (left hand going towards the back of your left knee)   Hold for 5 seconds.   Repeat 10 times.    Perform 3 sets daily     Also gave standing back extensions   And standing mini squats (10x1 for 3 times daily)  Pt demonstrated and verbalized understanding.

## 2016-03-28 ENCOUNTER — Ambulatory Visit: Payer: 59

## 2016-03-28 DIAGNOSIS — M25551 Pain in right hip: Secondary | ICD-10-CM

## 2016-03-28 DIAGNOSIS — M6281 Muscle weakness (generalized): Secondary | ICD-10-CM

## 2016-03-28 DIAGNOSIS — M5441 Lumbago with sciatica, right side: Principal | ICD-10-CM

## 2016-03-28 DIAGNOSIS — G8929 Other chronic pain: Secondary | ICD-10-CM

## 2016-03-28 NOTE — Therapy (Signed)
[place] [facility] [address] [place], [address] Phone: [phone]   Fax:  [phone]  Physical Therapy Treatment  Patient Details  Name: Rachel Ali MRN: [id number] Date of Birth: July 31, 1960 Referring Provider: Sharlet Salina, DO  Encounter Date: 03/28/2016      PT End of Session - 03/28/16 1751    Visit Number 10   Number of Visits 21   Date for PT Re-Evaluation 04/27/16   PT Start Time [id number]   PT Stop Time 1837   PT Time Calculation (min) 46 min   Activity Tolerance Patient tolerated treatment well   Behavior During Therapy [facility] for tasks assessed/performed      Past Medical History:  Diagnosis Date  . Allergy    sinus problem    Past Surgical History:  Procedure Laterality Date  . DILATION AND CURETTAGE OF UTERUS    . ESSURE TUBAL LIGATION    . OTHER SURGICAL HISTORY     coil placement in tubes to prevent pregnancy-patient could not recall the name of the procedure  . TONSILLECTOMY      There were no vitals filed for this visit.      Subjective Assessment - 03/28/16 1752    Subjective Back is doing pretty good. Hurts R low back area, 2/10 currently. 3/10 at most for the past 5 days from sitting. Tries to get up every hour. Also notices L lateral hip pain (hip abductor muscle) since today. Pt was going down the steps and noticed it.    Pertinent History Back and R posterior hip pain. Has had pain for 2 years, sudden onset, unknown method of injury. 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 Went to Dr Sharlet Salina, had an MRI which revealed a little bit of arthritis. Everything else looked good. Denies bowel or bladder problems or saddle anesthesia. Sometimes R foot feels like it is going to fall asleep (R lateral plantar surface).  Pt also adds that she slipped and fell on ice while walking to work 2 years ago and landed on her knee (does not know which knee).     Patient Stated Goals Try to work around the pain. Try to figure out what I am doing to aggravate it.    Currently in Pain? Yes   Pain Score 2    Pain Onset More than a month ago     Objectives   Manual therapy  Prone UPA to R L5 transverse process grade 3,   Improved mobility palpated.   Prone P to A to mid thoracic spine (at spinous process) grade 3 to promote mobility   No back pain after manual therapy      There-ex  Directed patient with L S/L bows and arrows 10x5 seconds for 2 sets  Reviewed and given as part of her HEP. PT demonstrated and verbalized understanding.   Standing L lumbar extension 10x5 seconds for 2 sets Standing R lumbar side bending 10x5 seconds for 2 sets  Prone manually resisted hip extension 1x each LE  reviewed progress with hip extension strength   ascending and descending 4 regular steps with emphasis on femoral control about 8x  SLS on R LE and L LE 30 seconds each, to promote glute med strengthening.   Reviewed progress with Modified Oswestry Low Back Pain Disability Questionnaire with pt.   Reviewed plan of care: plan for graduation from PT is next week if  pt continues to progress. Re-cert is for 2x/week for 4 weeks just in case pt needs more sessions afterwards.    Improved exercise technique, movement at target joints, use of target muscles after min to mod verbal, visual, tactile cues.     Pt demonstrates decreased femoral control L > R  LE with stair negotiation which improved with cues.   Pt demonstrates overall improved R low back and hip pain, improved function, ability to tolerate sitting for her work, and improved glute strength since initial evaluation. Pt still has difficulty with prolonged sitting at times when working due to R low back pain but improving. Pt will benefit from continued physical therapy services to address the aforementioned deficit. Pain improves with joint mobilization, and exercises to promote gentle back  extension.           [facility] PT Assessment - 03/28/16 0001      Observation/Other Assessments   Modified Oswertry 14%; 10% improvement since initial evaluation.   Measured on 03/21/2016     Strength   Right Hip Extension 4+/5   Left Hip Extension 4+/5                             PT Education - 03/28/16 1822    Education provided Yes   Education Details ther-ex, HEP, progress, plan of care   Person(s) Educated Patient   Methods Explanation;Demonstration;Tactile cues;Verbal cues;Handout   Comprehension Returned demonstration;Verbalized understanding             PT Long Term Goals - 03/28/16 1852      PT LONG TERM GOAL #1   Title Patient will have a decrease in low back and R hip pain to 2/10 or less at most to promote sitting tolerance for work and travel.    Baseline 5/10 at worst; 3/10 at worst (03/28/2016)   Time 4   Period Weeks   Status On-going     PT LONG TERM GOAL #2   Title Patient will improve her Modified Oswestry Low Back Pain Disability Questionnaire by at least 10% as a demonstration of improved function.    Baseline 24%; 14% (03/21/2016)   Time 6   Period Weeks   Status Achieved     PT LONG TERM GOAL #3   Title Patient will report being able to sit for at least 1.5 hours with 2/10 or less low back and R hip pain at worst to promote ability to perform her work tasks.    Baseline Increased pain to 5/10 after sitting for an hour; 3/10 (03/28/2016)   Time 4   Period Weeks   Status On-going     PT LONG TERM GOAL #4   Title Patient will improve bilateral hip extension strength by at least 1/2 MMT grade to promote ability to perform standing tasks with less low back and R hip pain.    Baseline 4+/5 bilaterally   Time 6   Period Weeks   Status Achieved               Plan - 03/28/16 1823    Clinical Impression Statement Pt demonstrates overall improved R low back and hip pain, improved function, ability to tolerate sitting  for her work, and improved glute strength since initial evaluation. Pt still has difficulty with prolonged sitting at times when working due to R low back pain but improving. Pt will benefit from continued physical therapy services to address the aforementioned  deficit. Pain improves with joint mobilization, and exercises to promote gentle back extension.    Rehab Potential Good   Clinical Impairments Affecting Rehab Potential chronicity of condition   PT Frequency 2x / week   PT Duration 6 weeks   PT Treatment/Interventions Therapeutic exercise;Therapeutic activities;Manual techniques;Dry needling;Neuromuscular re-education;Patient/family education;Traction;Ultrasound;Electrical Stimulation;Aquatic Therapy   PT Next Visit Plan posture, trunk extension, hip strengthening, manual therapy, lumbar and thoracic mobility   Consulted and Agree with Plan of Care Patient      Patient will benefit from skilled therapeutic intervention in order to improve the following deficits and impairments:  Pain, Decreased strength, Hypomobility  Visit Diagnosis: Chronic right-sided low back pain with right-sided sciatica - Plan: PT plan of care cert/re-cert  Pain in right hip - Plan: PT plan of care cert/re-cert  Muscle weakness (generalized) - Plan: PT plan of care cert/re-cert     Problem List Patient Active Problem List   Diagnosis Date Noted  . Annual physical exam 11/17/2015  . Low back pain 11/17/2015  . Allergic rhinitis 07/01/2015  . Anxiety 07/01/2015  . Body mass index (BMI) of 27.0-27.9 in adult 07/01/2015  . Blood pressure elevated 07/01/2015  . H/O disease 07/01/2015  . Blood glucose elevated 07/01/2015  . Climacteric 07/01/2015  . Osteopenia 07/01/2015  . Hemorrhage, postmenopausal 07/01/2015  . Skin lesion 07/01/2015  . Insomnia 12/10/2014  . Muscle spasm 10/27/2014    Joneen Boers PT, DPT   03/28/2016, 6:58 PM  [place] [facility] [address], [place], [address] Phone: [phone]   Fax:  [phone]  Name: YARISA PERSAUD MRN: [id number] Date of Birth: 03-30-1961

## 2016-03-28 NOTE — Patient Instructions (Addendum)
  Gave L S/L bows and arrows 10x2 with 5 seconds 2-3 times a day as part of her HEP. Handout provided. Can perform for her other side if it does not help so long as it does not bother her back.  Pt demonstrated and verbalized understanding.

## 2016-04-04 ENCOUNTER — Ambulatory Visit: Payer: 59

## 2016-04-04 DIAGNOSIS — G8929 Other chronic pain: Secondary | ICD-10-CM

## 2016-04-04 DIAGNOSIS — M25551 Pain in right hip: Secondary | ICD-10-CM

## 2016-04-04 DIAGNOSIS — M6281 Muscle weakness (generalized): Secondary | ICD-10-CM

## 2016-04-04 DIAGNOSIS — M5441 Lumbago with sciatica, right side: Secondary | ICD-10-CM

## 2016-04-04 NOTE — Therapy (Signed)
[facility] [address], [place], [address] Phone: [phone]   Fax:  [phone]  Physical Therapy Treatment  Patient Details  Name: Rachel Ali MRN: [id number] Date of Birth: [birth year] Referring Provider: Sharlet Salina, DO  Encounter Date: 04/04/2016      PT End of Session - 04/04/16 1754    Visit Number 11   Number of Visits 21   Date for PT Re-Evaluation 04/27/16   PT Start Time [id number]   PT Stop Time 1843   PT Time Calculation (min) 47 min   Activity Tolerance Patient tolerated treatment well   Behavior During Therapy [facility] for tasks assessed/performed      Past Medical History:  Diagnosis Date  . Allergy    sinus problem    Past Surgical History:  Procedure Laterality Date  . DILATION AND CURETTAGE OF UTERUS    . ESSURE TUBAL LIGATION    . OTHER SURGICAL HISTORY     coil placement in tubes to prevent pregnancy-patient could not recall the name of the procedure  . TONSILLECTOMY      There were no vitals filed for this visit.      Subjective Assessment - 04/04/16 1757    Subjective Back is doing pretty good. Notices R posteior hip pain once in a while but not all the time. 1/10 R posterior hip pain currently. Has R lateral thigh discomfort.    Pertinent History Back and R posterior hip pain. Has had pain for 2 years, sudden onset, unknown method of injury. 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 Went to Dr Sharlet Salina, had an MRI which revealed a little bit of arthritis. Everything else looked good. Denies bowel or bladder problems or saddle anesthesia. Sometimes R foot feels like it is going to fall asleep (R lateral plantar surface).  Pt also adds that she slipped and fell on ice while walking to work 2 years ago and landed on her knee (does not know which knee).     Patient Stated Goals Try to work around the pain. Try to figure out what I am doing to  aggravate it.    Currently in Pain? Yes   Pain Score 1    Pain Onset More than a month ago                                 PT Education - 04/04/16 1810    Education provided Yes   Education Details ther-ex   [employer]) Educated Patient   Methods Explanation;Demonstration;Tactile cues;Verbal cues   Comprehension Returned demonstration;Verbalized understanding        Objectives      There-ex  Directed patient with sitting up straight (to promote gentle lumbar extension) 10x2 with 5 second holds  Decreased R lateral thigh discomfort.   Sitting with bilateral scapular retraction 10x2 with 10 second holds to promote proper sitting posture and thoracic extension   L S/L bows and arrows 10x5 seconds for 2 sets  ascending and descending 4 regular steps with emphasis on femoral control 10x  SLS with opposite tip toe assist 10x 5 seconds for 2 sets for glute med strengthening  Sitting with proper posture, with manual perturbations from PT  30 seconds x 5    Supine bridge 10x3 with bilateral ankle DF and shoulder extension isometrics  Supine alternating leg extension  with core activation 10x each LE. Slight R posterior hip discomfort with R leg extension. Decreases with rest.   Supine bridge 10x2 again afterwards. Decreased R hip discomfort.    Standing L lumbar extension 10x5 seconds Standing R lumbar side bending 10x5 seconds  Then sitting up straight with proper posture and scapular retraction.   Decreased R posterior hip symptoms.     Improved exercise technique, movement at target joints, use of target muscles after min to mod verbal, visual, tactile cues.     Decreased R posterior hip and lateral thigh symptoms with back extension related positions and movements. Pt making good progress towards goal of decreased low back and R hip pain.            PT Long Term Goals - 03/28/16 1852      PT LONG TERM GOAL #1   Title Patient will  have a decrease in low back and R hip pain to 2/10 or less at most to promote sitting tolerance for work and travel.    Baseline 5/10 at worst; 3/10 at worst (03/28/2016)   Time 4   Period Weeks   Status On-going     PT LONG TERM GOAL #2   Title Patient will improve her Modified Oswestry Low Back Pain Disability Questionnaire by at least 10% as a demonstration of improved function.    Baseline 24%; 14% (03/21/2016)   Time 6   Period Weeks   Status Achieved     PT LONG TERM GOAL #3   Title Patient will report being able to sit for at least 1.5 hours with 2/10 or less low back and R hip pain at worst to promote ability to perform her work tasks.    Baseline Increased pain to 5/10 after sitting for an hour; 3/10 (03/28/2016)   Time 4   Period Weeks   Status On-going     PT LONG TERM GOAL #4   Title Patient will improve bilateral hip extension strength by at least 1/2 MMT grade to promote ability to perform standing tasks with less low back and R hip pain.    Baseline 4+/5 bilaterally   Time 6   Period Weeks   Status Achieved               Plan - 04/04/16 1817    Clinical Impression Statement Decreased R posterior hip and lateral thigh symptoms with back extension related positions and movements. Pt making good progress towards goal of decreased low back and R hip pain.    Rehab Potential Good   Clinical Impairments Affecting Rehab Potential chronicity of condition   PT Frequency 2x / week   PT Duration 6 weeks   PT Treatment/Interventions Therapeutic exercise;Therapeutic activities;Manual techniques;Dry needling;Neuromuscular re-education;Patient/family education;Traction;Ultrasound;Electrical Stimulation;Aquatic Therapy   PT Next Visit Plan posture, trunk extension, hip strengthening, manual therapy, lumbar and thoracic mobility   Consulted and Agree with Plan of Care Patient      Patient will benefit from skilled therapeutic intervention in order to improve the following  deficits and impairments:  Pain, Decreased strength, Hypomobility  Visit Diagnosis: Pain in right hip  Muscle weakness (generalized)  Chronic right-sided low back pain with right-sided sciatica     Problem List Patient Active Problem List   Diagnosis Date Noted  . Annual physical exam 11/17/2015  . Low back pain 11/17/2015  . Allergic rhinitis 07/01/2015  . Anxiety 07/01/2015  . Body mass index (BMI) of 27.0-27.9 in adult 07/01/2015  . Blood  pressure elevated 07/01/2015  . H/O disease 07/01/2015  . Blood glucose elevated 07/01/2015  . Climacteric 07/01/2015  . Osteopenia 07/01/2015  . Hemorrhage, postmenopausal 07/01/2015  . Skin lesion 07/01/2015  . Insomnia 12/10/2014  . Muscle spasm 10/27/2014    Joneen Boers PT, DPT   04/04/2016, 6:52 PM  [facility] [address], [place], [address] Phone: [phone]   Fax:  [phone]  Name: MEAGEN VATH MRN: [id number] Date of Birth: 16-Sep-1960

## 2016-04-06 ENCOUNTER — Ambulatory Visit: Payer: 59

## 2016-04-06 DIAGNOSIS — M5441 Lumbago with sciatica, right side: Secondary | ICD-10-CM | POA: Diagnosis not present

## 2016-04-06 DIAGNOSIS — M25551 Pain in right hip: Secondary | ICD-10-CM

## 2016-04-06 DIAGNOSIS — M6281 Muscle weakness (generalized): Secondary | ICD-10-CM

## 2016-04-06 DIAGNOSIS — G8929 Other chronic pain: Secondary | ICD-10-CM

## 2016-04-06 NOTE — Patient Instructions (Addendum)
   Bridge   Lie on back, legs bent. Activate abdominal and pelvic floor muscles,  Press hands onto surface you are on Squeeze rear end muscles   Lift hips up. Hold for 5 seconds.  Repeat __10__ times. Do __3__ sessions per day.  Copyright  VHI. All rights reserved.    Also gave single leg stand with opposite tip toe assist with emphasis on pelvic control 10x3 with 5 second holds daily as part of HEP. Pt demonstrated and verbalized understanding. Handout provided.

## 2016-04-06 NOTE — Therapy (Signed)
[facility] [address] [place], [address] Phone: [phone]   Fax:  [phone]  Physical Therapy Treatment And Discharge Summary  Patient Details  Name: Rachel Ali MRN: [id number] Date of Birth: [birth year] Referring Provider: Sharlet Salina, DO  Encounter Date: 04/06/2016      PT End of Session - 04/06/16 1705    Visit Number 12   Number of Visits 21   Date for PT Re-Evaluation 04/27/16   PT Start Time [id number]   PT Stop Time 1750   PT Time Calculation (min) 45 min   Activity Tolerance Patient tolerated treatment well   Behavior During Therapy [facility] for tasks assessed/performed      Past Medical History:  Diagnosis Date  . Allergy    sinus problem    Past Surgical History:  Procedure Laterality Date  . DILATION AND CURETTAGE OF UTERUS    . ESSURE TUBAL LIGATION    . OTHER SURGICAL HISTORY     coil placement in tubes to prevent pregnancy-patient could not recall the name of the procedure  . TONSILLECTOMY      There were no vitals filed for this visit.      Subjective Assessment - 04/06/16 1707    Subjective Back and hip are doing pretty good. Still a little bit... It's manageable. Not like it used to be. No back pain when sitting or standing. Had a little bit of pain today. The longer I stand, it's coming back.  3/10 low back and R LE (during Tuesday's session) at most for the past 7 days (1/10 at most for the past 2 days). Able to sit about 1.5 hours without back pain.  Feels like she can continue with her HEP after today.    Pertinent History Back and R posterior hip pain. Has had pain for 2 years, sudden onset, unknown method of injury. 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 Went to Dr Sharlet Salina, had an MRI which revealed a little bit of arthritis. Everything else looked good. Denies bowel or bladder problems or saddle anesthesia. Sometimes R foot feels like  it is going to fall asleep (R lateral plantar surface).  Pt also adds that she slipped and fell on ice while walking to work 2 years ago and landed on her knee (does not know which knee).     Patient Stated Goals Try to work around the pain. Try to figure out what I am doing to aggravate it.    Currently in Pain? No/denies   Pain Onset More than a month ago                                 PT Education - 04/06/16 1813    Education provided Yes   Education Details ther-ex, HEP   Person(s) Educated Patient   Methods Explanation;Demonstration;Tactile cues;Verbal cues;Handout   Comprehension Returned demonstration;Verbalized understanding        Objectives      There-ex  Reviewed gym exercises such as lunges (with femoral control) squats, rows, biceps curls, hamstring curls (so long as she works her glutes such as performing her supine bridge exercise). Emphasis on good posture, scapular retraction, and form. Hold off on leg press machine, seated hip abduction machine and seated knee extension machines secondary to possible increase in symptoms. Pt demonstrated and verbalized understanding.  Directed patient with Omega machine rows plate 10 for 10 times, cues for scapular retraction   Standing bilateral shoulder extension at [facility] machine plate 10 for 14E1 with transversus abdominis and pelvic floor contraction as well as bilateral scapular retraction.   Reviewed progress/current status with PT towards goals.   Supine bridge 10x2 with 5 second holds  SLS with opposite tip toe assist 10x2 with 5 second holds   Reviewed HEP. Pt demonstrated and verbalized understanding.    Improved exercise technique, movement at target joints, use of target muscles after min to mod verbal, visual, tactile cues.     Patient has demonstrated overall decreased back and hip pain level at worst, improved sitting tolerance, bilateral hip extension strength, and improved  function since initial evaluation. Patient has progressed very well with physical therapy towards goals and patient demonstrates independence with her home exercise program. Skilled physical therapy services discharged with patient continuing her progress with her exercises at home.           PT Long Term Goals - 04/06/16 1727      PT LONG TERM GOAL #1   Title Patient will have a decrease in low back and R hip pain to 2/10 or less at most to promote sitting tolerance for work and travel.    Baseline 5/10 at worst; 3/10 at worst (03/28/2016); 3/10 after last session but 1/10 at most since then (04/06/2016)   Time 4   Period Weeks   Status Partially Met     PT LONG TERM GOAL #2   Title Patient will improve her Modified Oswestry Low Back Pain Disability Questionnaire by at least 10% as a demonstration of improved function.    Baseline 24%; 14% (03/21/2016)   Time 6   Period Weeks   Status Achieved     PT LONG TERM GOAL #3   Title Patient will report being able to sit for at least 1.5 hours with 2/10 or less low back and R hip pain at worst to promote ability to perform her work tasks.    Baseline Increased pain to 5/10 after sitting for an hour; 3/10 (03/28/2016). No pain with sitting 1.5 hours (04/06/2016)   Time 4   Period Weeks   Status Achieved     PT LONG TERM GOAL #4   Title Patient will improve bilateral hip extension strength by at least 1/2 MMT grade to promote ability to perform standing tasks with less low back and R hip pain.    Baseline 4+/5 bilaterally   Time 6   Period Weeks   Status Achieved               Plan - 04/06/16 1754    Clinical Impression Statement Patient has demonstrated overall decreased back and hip pain level at worst, improved sitting tolerance, bilateral hip extension strength, and improved function since initial evaluation. Patient has progressed very well with physical therapy towards goals and patient demonstrates independence with her  home exercise program. Skilled physical therapy services discharged with patient continuing her progress with her exercises at home.     Rehab Potential Good   Clinical Impairments Affecting Rehab Potential chronicity of condition   PT Frequency --   PT Duration --   PT Treatment/Interventions Therapeutic exercise;Therapeutic activities;Manual techniques;Neuromuscular re-education;Patient/family education   PT Next Visit Plan Continue progress with her home exercise program.    Consulted and Agree with Plan of Care Patient      Patient will benefit from skilled  therapeutic intervention in order to improve the following deficits and impairments:  Pain, Decreased strength, Hypomobility  Visit Diagnosis: Pain in right hip  Muscle weakness (generalized)  Chronic right-sided low back pain with right-sided sciatica     Problem List Patient Active Problem List   Diagnosis Date Noted  . Annual physical exam 11/17/2015  . Low back pain 11/17/2015  . Allergic rhinitis 07/01/2015  . Anxiety 07/01/2015  . Body mass index (BMI) of 27.0-27.9 in adult 07/01/2015  . Blood pressure elevated 07/01/2015  . H/O disease 07/01/2015  . Blood glucose elevated 07/01/2015  . Climacteric 07/01/2015  . Osteopenia 07/01/2015  . Hemorrhage, postmenopausal 07/01/2015  . Skin lesion 07/01/2015  . Insomnia 12/10/2014  . Muscle spasm 10/27/2014    Thank you for your referral.   Joneen Boers PT, DPT   04/06/2016, 6:18 PM  [facility] [address] [place], [address] Phone: [phone]   Fax:  [phone]  Name: Rachel Ali MRN: [id number] Date of Birth: [birth year]

## 2016-06-14 DIAGNOSIS — J0140 Acute pansinusitis, unspecified: Secondary | ICD-10-CM | POA: Diagnosis not present

## 2016-07-14 DIAGNOSIS — J0100 Acute maxillary sinusitis, unspecified: Secondary | ICD-10-CM | POA: Diagnosis not present

## 2016-07-14 DIAGNOSIS — R05 Cough: Secondary | ICD-10-CM | POA: Diagnosis not present

## 2016-07-24 ENCOUNTER — Telehealth: Payer: Self-pay | Admitting: Physician Assistant

## 2016-07-24 NOTE — Telephone Encounter (Signed)
Noted  

## 2016-07-24 NOTE — Telephone Encounter (Signed)
Patient reports she will call [employer] to cancel her request for FMLA. Patient reports she was out with a cold for about a week. Patient reports she was not seen by any provider. sd

## 2016-07-24 NOTE — Telephone Encounter (Signed)
[employer] calling to make sure the "Attending Provider Statement" form has been received.  Please call to confirm [phone] ext [phone].

## 2016-07-24 NOTE — Telephone Encounter (Signed)
I have not seen this and I have never seen this patient. Patient will need appt with me before I can fill out. She has been seeing Dr. Sharlet Salina so maybe this was sent to them.

## 2016-07-24 NOTE — Telephone Encounter (Signed)
Please review. I haven't see it.  Thanks,  -Jadelin Eng

## 2016-09-18 ENCOUNTER — Other Ambulatory Visit: Payer: Self-pay | Admitting: Physician Assistant

## 2016-09-18 DIAGNOSIS — F419 Anxiety disorder, unspecified: Secondary | ICD-10-CM

## 2016-11-20 ENCOUNTER — Ambulatory Visit (INDEPENDENT_AMBULATORY_CARE_PROVIDER_SITE_OTHER): Payer: 59 | Admitting: Physician Assistant

## 2016-11-20 ENCOUNTER — Encounter: Payer: Self-pay | Admitting: Physician Assistant

## 2016-11-20 VITALS — BP 110/70 | HR 88 | Temp 98.1°F | Resp 16 | Ht 64.0 in | Wt 164.2 lb

## 2016-11-20 DIAGNOSIS — Z6828 Body mass index (BMI) 28.0-28.9, adult: Secondary | ICD-10-CM | POA: Diagnosis not present

## 2016-11-20 DIAGNOSIS — Z1231 Encounter for screening mammogram for malignant neoplasm of breast: Secondary | ICD-10-CM | POA: Diagnosis not present

## 2016-11-20 DIAGNOSIS — Z0000 Encounter for general adult medical examination without abnormal findings: Secondary | ICD-10-CM

## 2016-11-20 DIAGNOSIS — R739 Hyperglycemia, unspecified: Secondary | ICD-10-CM | POA: Diagnosis not present

## 2016-11-20 DIAGNOSIS — Z124 Encounter for screening for malignant neoplasm of cervix: Secondary | ICD-10-CM

## 2016-11-20 DIAGNOSIS — Z1159 Encounter for screening for other viral diseases: Secondary | ICD-10-CM

## 2016-11-20 DIAGNOSIS — Z114 Encounter for screening for human immunodeficiency virus [HIV]: Secondary | ICD-10-CM | POA: Diagnosis not present

## 2016-11-20 NOTE — Patient Instructions (Signed)

## 2016-11-20 NOTE — Progress Notes (Signed)
Patient: Rachel Ali, Female    DOB: [birth year], 56 y.o.   MRN: [id number] Visit Date: 11/20/2016  Today's Provider: Mar Daring, PA-C   Chief Complaint  Patient presents with  . Annual Exam   Subjective:    Annual physical exam Rachel Ali is a 56 y.o. female who presents today for health maintenance and complete physical. She feels well. She reports exercising 5 days a week. She reports she is sleeping well.  11/17/15 CPE 11/25/15 Mammogram-BI-RADS 1 12/15/11 Pap-neg; HPV-neg 12/17/15 colonoscopy-normal. Dr.  Allen Norris; repeat in 10 years. -----------------------------------------------------------------   Review of Systems  Constitutional: Positive for fatigue.  HENT: Positive for ear pain, postnasal drip, rhinorrhea and sinus pain.   Eyes: Positive for pain.  Respiratory: Negative.   Cardiovascular: Negative.   Gastrointestinal: Negative.   Endocrine: Negative.   Genitourinary: Negative.   Musculoskeletal: Negative.   Skin: Negative.   Allergic/Immunologic: Positive for environmental allergies.  Neurological: Positive for headaches.  Hematological: Negative.   Psychiatric/Behavioral: Negative.   Chronic environmental allergy symptoms  Social History      She  reports that she quit smoking about 23 years ago. She has a 1.50 pack-year smoking history. She has never used smokeless tobacco. She reports that she does not drink alcohol or use drugs.       Social History   Social History  . Marital status: Married    Spouse name: Magda Paganini  . Number of children: 1  . Years of education: Associates   Occupational History  .  Lab [employer]   Social History Main Topics  . Smoking status: Former Smoker    Packs/day: 0.50    Years: 3.00    Quit date: 05/22/1993  . Smokeless tobacco: Never Used  . Alcohol use No     Comment: rare  . Drug use: No  . Sexual activity: Yes    Birth control/ protection: Post-menopausal   Other Topics Concern  . None   Social  History Narrative  . None    Past Medical History:  Diagnosis Date  . Allergy    sinus problem     Patient Active Problem List   Diagnosis Date Noted  . Annual physical exam 11/17/2015  . Low back pain 11/17/2015  . Allergic rhinitis 07/01/2015  . Anxiety 07/01/2015  . Body mass index (BMI) of 27.0-27.9 in adult 07/01/2015  . Blood pressure elevated 07/01/2015  . H/O disease 07/01/2015  . Blood glucose elevated 07/01/2015  . Climacteric 07/01/2015  . Osteopenia 07/01/2015  . Hemorrhage, postmenopausal 07/01/2015  . Skin lesion 07/01/2015  . Insomnia 12/10/2014  . Muscle spasm 10/27/2014    Past Surgical History:  Procedure Laterality Date  . DILATION AND CURETTAGE OF UTERUS    . ESSURE TUBAL LIGATION    . OTHER SURGICAL HISTORY     coil placement in tubes to prevent pregnancy-patient could not recall the name of the procedure  . TONSILLECTOMY      Family History        Family Status  Relation Status  . Mother Alive  . Father Deceased at age 55  . Brother Alive  . MGM Deceased at age 20  . MGF Deceased  . PGM Deceased at age 38  . PGF Deceased        Her family history includes Cancer in her father; Congestive Heart Failure in her maternal grandmother and paternal grandmother; Heart disease in her father and maternal grandfather; Hypertension in  her brother, father, and mother; Stroke in her paternal grandfather; Uterine cancer in her mother.     Allergies  Allergen Reactions  . Cinnamon     mild Trouble breathing.  Marland Kitchen Penicillins     Itching     Current Outpatient Prescriptions:  .  albuterol (PROVENTIL HFA;VENTOLIN HFA) 108 (90 Base) MCG/ACT inhaler, Inhale 1-2 puffs into the lungs every 6 (six) hours as needed for wheezing or shortness of breath., Disp: , Rfl:  .  azelastine (ASTELIN) 0.1 % nasal spray, Place 2 sprays into both nostrils 2 (two) times daily., Disp: 90 mL, Rfl: 1 .  calcium gluconate 500 MG tablet, Take by mouth., Disp: , Rfl:  .   Cholecalciferol 1000 units capsule, Take by mouth., Disp: , Rfl:  .  diphenhydrAMINE (SOMINEX) 25 MG tablet, Take 25 mg by mouth at bedtime as needed for sleep., Disp: , Rfl:  .  FLAXSEED, LINSEED, PO, Take by mouth., Disp: , Rfl:  .  Ibuprofen 200 MG CAPS, Take by mouth., Disp: , Rfl:  .  lansoprazole (PREVACID) 15 MG capsule, Take 15 mg by mouth as needed., Disp: , Rfl:  .  Multiple Vitamin tablet, Take by mouth., Disp: , Rfl:  .  sertraline (ZOLOFT) 50 MG tablet, TAKE 1 TABLET BY MOUTH  DAILY, Disp: 90 tablet, Rfl: 1 .  traZODone (DESYREL) 50 MG tablet, Take 1 tablet (50 mg total) by mouth at bedtime., Disp: 90 tablet, Rfl: 1 .  baclofen (LIORESAL) 10 MG tablet, Take 1 tablet (10 mg total) by mouth at bedtime as needed for muscle spasms. (Patient not taking: Reported on 11/20/2016), Disp: 90 tablet, Rfl: 1 .  Caffeine-Magnesium Salicylate (DIUREX) 16-109.6 MG TABS, Take by mouth., Disp: , Rfl:    Patient Care Team: Mar Daring, PA-C as PCP - General (Family Medicine)      Objective:   Vitals: BP 110/70 (BP Location: Left Arm, Patient Position: Sitting, Cuff Size: Large)   Pulse 88   Temp 98.1 F (36.7 C) (Oral)   Resp 16   Ht 5\' 4"  (1.626 m)   Wt 164 lb 3.2 oz (74.5 kg)   BMI 28.18 kg/m    Vitals:   11/20/16 1434  BP: 110/70  Pulse: 88  Resp: 16  Temp: 98.1 F (36.7 C)  TempSrc: Oral  Weight: 164 lb 3.2 oz (74.5 kg)  Height: 5\' 4"  (1.626 m)     Physical Exam  Constitutional: She is oriented to person, place, and time. She appears well-developed and well-nourished. No distress.  HENT:  Head: Normocephalic and atraumatic.  Right Ear: Hearing, tympanic membrane, external ear and ear canal normal.  Left Ear: Hearing, tympanic membrane, external ear and ear canal normal.  Nose: Nose normal.  Mouth/Throat: Uvula is midline, oropharynx is clear and moist and mucous membranes are normal. No oropharyngeal exudate.  Eyes: Conjunctivae and EOM are normal. Pupils are  equal, round, and reactive to light. Right eye exhibits no discharge. Left eye exhibits no discharge. No scleral icterus.  Neck: Normal range of motion. Neck supple. No JVD present. Carotid bruit is not present. No tracheal deviation present. No thyromegaly present.  Cardiovascular: Normal rate, regular rhythm, normal heart sounds and intact distal pulses.  Exam reveals no gallop and no friction rub.   No murmur heard. Pulmonary/Chest: Effort normal and breath sounds normal. No respiratory distress. She has no wheezes. She has no rales. She exhibits no tenderness. Right breast exhibits no inverted nipple, no mass, no nipple discharge,  no skin change and no tenderness. Left breast exhibits no inverted nipple, no mass, no nipple discharge, no skin change and no tenderness. Breasts are symmetrical.  Abdominal: Soft. Bowel sounds are normal. She exhibits no distension and no mass. There is no tenderness. There is no rebound and no guarding. Hernia confirmed negative in the right inguinal area and confirmed negative in the left inguinal area.  Genitourinary: Rectum normal, vagina normal and uterus normal. No breast swelling, tenderness, discharge or bleeding. Pelvic exam was performed with patient supine. There is no rash, tenderness, lesion or injury on the right labia. There is no rash, tenderness, lesion or injury on the left labia. Cervix exhibits no motion tenderness, no discharge and no friability. Right adnexum displays no mass, no tenderness and no fullness. Left adnexum displays no mass, no tenderness and no fullness. No erythema, tenderness or bleeding in the vagina. No signs of injury around the vagina. No vaginal discharge found.  Musculoskeletal: Normal range of motion. She exhibits no edema or tenderness.  Lymphadenopathy:    She has no cervical adenopathy.       Right: No inguinal adenopathy present.       Left: No inguinal adenopathy present.  Neurological: She is alert and oriented to person,  place, and time. She has normal reflexes. No cranial nerve deficit. Coordination normal.  Skin: Skin is warm and dry. No rash noted. She is not diaphoretic.  Psychiatric: She has a normal mood and affect. Her behavior is normal. Judgment and thought content normal.  Vitals reviewed.    Depression Screen PHQ 2/9 Scores 11/20/2016 11/17/2015  PHQ - 2 Score 0 0  PHQ- 9 Score 4 -      Assessment & Plan:     Routine Health Maintenance and Physical Exam  Exercise Activities and Dietary recommendations Goals    None      Immunization History  Administered Date(s) Administered  . Influenza,inj,Quad PF,36+ Mos 03/13/2015, 03/18/2016    Health Maintenance  Topic Date Due  . Hepatitis C Screening  [birth year]  . HIV Screening  10/24/1976  . TETANUS/TDAP  10/24/1980  . COLONOSCOPY  10/25/2011  . PAP SMEAR  12/15/2014  . MAMMOGRAM  11/24/2016  . INFLUENZA VACCINE  12/20/2016     Discussed health benefits of physical activity, and encouraged her to engage in regular exercise appropriate for her age and condition.    1. Annual physical exam Normal physical exam today. Will check labs as below and f/u pending lab results. If labs are stable and WNL she will not need to have these rechecked for one year at her next annual physical exam. She is to call the office in the meantime if she has any acute issue, questions or concerns. - CBC with Differential/Platelet - Comprehensive metabolic panel - Hemoglobin A1c - Lipid Panel With LDL/HDL Ratio - TSH  2. Encounter for screening mammogram for breast cancer Patient had normal breast exam today and normal mammogram last year. She wishes to only do mammogram every other year.   3. Cervical cancer screening Pap collected today. Will send as below and f/u pending results. - Pap IG and HPV (high risk) DNA detection  4. Blood glucose elevated Will check labs as below and f/u pending results. - Hemoglobin A1c  5. BMI  28.0-28.9,adult Counseled patient on healthy lifestyle modifications including dieting and exercise.   6. Need for hepatitis C screening test - Hepatitis C Antibody  7. Screening for HIV without presence of risk factors -  HIV antibody (with reflex)  --------------------------------------------------------------------    Mar Daring, PA-C  [facility]

## 2016-11-22 LAB — PAP IG AND HPV HIGH-RISK
HPV, high-risk: NEGATIVE
PAP Smear Comment: 0

## 2016-11-27 DIAGNOSIS — R739 Hyperglycemia, unspecified: Secondary | ICD-10-CM | POA: Diagnosis not present

## 2016-11-27 DIAGNOSIS — Z0000 Encounter for general adult medical examination without abnormal findings: Secondary | ICD-10-CM | POA: Diagnosis not present

## 2016-11-27 DIAGNOSIS — Z1159 Encounter for screening for other viral diseases: Secondary | ICD-10-CM | POA: Diagnosis not present

## 2016-11-28 ENCOUNTER — Telehealth: Payer: Self-pay

## 2016-11-28 LAB — CBC WITH DIFFERENTIAL/PLATELET
Basophils Absolute: 0 10*3/uL (ref 0.0–0.2)
Basos: 1 %
EOS (ABSOLUTE): 0.2 10*3/uL (ref 0.0–0.4)
Eos: 5 %
Hematocrit: 38.3 % (ref 34.0–46.6)
Hemoglobin: 12.5 g/dL (ref 11.1–15.9)
Immature Grans (Abs): 0 10*3/uL (ref 0.0–0.1)
Immature Granulocytes: 0 %
Lymphocytes Absolute: 1.7 10*3/uL (ref 0.7–3.1)
Lymphs: 40 %
MCH: 30.9 pg (ref 26.6–33.0)
MCHC: 32.6 g/dL (ref 31.5–35.7)
MCV: 95 fL (ref 79–97)
Monocytes Absolute: 0.4 10*3/uL (ref 0.1–0.9)
Monocytes: 10 %
Neutrophils Absolute: 1.9 10*3/uL (ref 1.4–7.0)
Neutrophils: 44 %
Platelets: 234 10*3/uL (ref 150–379)
RBC: 4.04 x10E6/uL (ref 3.77–5.28)
RDW: 12.5 % (ref 12.3–15.4)
WBC: 4.3 10*3/uL (ref 3.4–10.8)

## 2016-11-28 LAB — LIPID PANEL WITH LDL/HDL RATIO
Cholesterol, Total: 191 mg/dL (ref 100–199)
HDL: 52 mg/dL (ref >39)
LDL Calculated: 109 mg/dL — ABNORMAL HIGH (ref 0–99)
LDl/HDL Ratio: 2.1 ratio (ref 0.0–3.2)
Triglycerides: 148 mg/dL (ref 0–149)
VLDL Cholesterol Cal: 30 mg/dL (ref 5–40)

## 2016-11-28 LAB — COMPREHENSIVE METABOLIC PANEL
ALT: 24 IU/L (ref 0–32)
AST: 24 IU/L (ref 0–40)
Albumin/Globulin Ratio: 2 (ref 1.2–2.2)
Albumin: 4.3 g/dL (ref 3.5–5.5)
Alkaline Phosphatase: 131 IU/L — ABNORMAL HIGH (ref 39–117)
BUN/Creatinine Ratio: 23 (ref 9–23)
BUN: 18 mg/dL (ref 6–24)
Bilirubin Total: 0.2 mg/dL (ref 0.0–1.2)
CO2: 26 mmol/L (ref 20–29)
Calcium: 9.3 mg/dL (ref 8.7–10.2)
Chloride: 105 mmol/L (ref 96–106)
Creatinine, Ser: 0.8 mg/dL (ref 0.57–1.00)
GFR calc Af Amer: 96 mL/min/{1.73_m2} (ref >59)
GFR calc non Af Amer: 83 mL/min/{1.73_m2} (ref >59)
Globulin, Total: 2.2 g/dL (ref 1.5–4.5)
Glucose: 99 mg/dL (ref 65–99)
Potassium: 4.4 mmol/L (ref 3.5–5.2)
Sodium: 144 mmol/L (ref 134–144)
Total Protein: 6.5 g/dL (ref 6.0–8.5)

## 2016-11-28 LAB — HEPATITIS C ANTIBODY: Hep C Virus Ab: <0.1 s/co ratio (ref 0.0–0.9)

## 2016-11-28 LAB — HEMOGLOBIN A1C
Est. average glucose Bld gHb Est-mCnc: 114 mg/dL
Hgb A1c MFr Bld: 5.6 % (ref 4.8–5.6)

## 2016-11-28 LAB — TSH: TSH: 2.13 u[IU]/mL (ref 0.450–4.500)

## 2016-11-28 LAB — HIV ANTIBODY (ROUTINE TESTING W REFLEX): HIV Screen 4th Generation wRfx: NONREACTIVE

## 2016-11-28 NOTE — Telephone Encounter (Signed)
Left message to call back  

## 2016-11-28 NOTE — Telephone Encounter (Signed)
Pt is returning call.  BU#[phone]/[id number]

## 2016-11-28 NOTE — Telephone Encounter (Signed)
Advised patient of results.  

## 2016-11-28 NOTE — Telephone Encounter (Signed)
-----   Message from Mar Daring, PA-C sent at 11/28/2016  7:54 AM EDT ----- Labs are fairly normal and stable from last year. Cholesterol has increased slightly from last year. ASCVD 10 yr risk is low at 1.49% so no need for cholesterol lowering medication but just try to focus on healthy lifestyle changes including healthy dieting, limiting fatty foods, carbs and sugars, and red meats. Increase physical activity to try to get in 150 min of moderate activity per week.

## 2016-12-14 DIAGNOSIS — D485 Neoplasm of uncertain behavior of skin: Secondary | ICD-10-CM | POA: Diagnosis not present

## 2016-12-14 DIAGNOSIS — L820 Inflamed seborrheic keratosis: Secondary | ICD-10-CM | POA: Diagnosis not present

## 2016-12-14 DIAGNOSIS — Z1283 Encounter for screening for malignant neoplasm of skin: Secondary | ICD-10-CM | POA: Diagnosis not present

## 2016-12-14 DIAGNOSIS — L578 Other skin changes due to chronic exposure to nonionizing radiation: Secondary | ICD-10-CM | POA: Diagnosis not present

## 2016-12-28 ENCOUNTER — Ambulatory Visit (INDEPENDENT_AMBULATORY_CARE_PROVIDER_SITE_OTHER): Payer: 59 | Admitting: Family Medicine

## 2016-12-28 ENCOUNTER — Encounter: Payer: Self-pay | Admitting: Family Medicine

## 2016-12-28 VITALS — BP 112/76 | HR 84 | Temp 97.5°F | Resp 16 | Wt 162.0 lb

## 2016-12-28 DIAGNOSIS — H9203 Otalgia, bilateral: Secondary | ICD-10-CM | POA: Diagnosis not present

## 2016-12-28 DIAGNOSIS — H6123 Impacted cerumen, bilateral: Secondary | ICD-10-CM

## 2016-12-28 MED ORDER — CARBAMIDE PEROXIDE 6.5 % OT SOLN: 5.0000 [drp] | Freq: Two times a day (BID) | OTIC | 1 refills | Status: DC

## 2016-12-28 NOTE — Patient Instructions (Signed)
Earwax Buildup, Adult The ears produce a substance called earwax that helps keep bacteria out of the ear and protects the skin in the ear canal. Occasionally, earwax can build up in the ear and cause discomfort or hearing loss. What increases the risk? This condition is more likely to develop in people who:  Are female.  Are elderly.  Naturally produce more earwax.  Clean their ears often with cotton swabs.  Use earplugs often.  Use in-ear headphones often.  Wear hearing aids.  Have narrow ear canals.  Have earwax that is overly thick or sticky.  Have eczema.  Are dehydrated.  Have excess hair in the ear canal.  What are the signs or symptoms? Symptoms of this condition include:  Reduced or muffled hearing.  A feeling of fullness in the ear or feeling that the ear is plugged.  Fluid coming from the ear.  Ear pain.  Ear itch.  Ringing in the ear.  Coughing.  An obvious piece of earwax that can be seen inside the ear canal.  How is this diagnosed? This condition may be diagnosed based on:  Your symptoms.  Your medical history.  An ear exam. During the exam, your health care provider will look into your ear with an instrument called an otoscope.  You may have tests, including a hearing test. How is this treated? This condition may be treated by:  Using ear drops to soften the earwax.  Having the earwax removed by a health care provider. The health care provider may: ? Flush the ear with water. ? Use an instrument that has a loop on the end (curette). ? Use a suction device.  Surgery to remove the wax buildup. This may be done in severe cases.  Follow these instructions at home:  Take over-the-counter and prescription medicines only as told by your health care provider.  Do not put any objects, including cotton swabs, into your ear. You can clean the opening of your ear canal with a washcloth or facial tissue.  Follow instructions from your health  care provider about cleaning your ears. Do not over-clean your ears.  Drink enough fluid to keep your urine clear or pale yellow. This will help to thin the earwax.  Keep all follow-up visits as told by your health care provider. If earwax builds up in your ears often or if you use hearing aids, consider seeing your health care provider for routine, preventive ear cleanings. Ask your health care provider how often you should schedule your cleanings.  If you have hearing aids, clean them according to instructions from the manufacturer and your health care provider. Contact a health care provider if:  You have ear pain.  You develop a fever.  You have blood, pus, or other fluid coming from your ear.  You have hearing loss.  You have ringing in your ears that does not go away.  Your symptoms do not improve with treatment.  You feel like the room is spinning (vertigo). Summary  Earwax can build up in the ear and cause discomfort or hearing loss.  The most common symptoms of this condition include reduced or muffled hearing and a feeling of fullness in the ear or feeling that the ear is plugged.  This condition may be diagnosed based on your symptoms, your medical history, and an ear exam.  This condition may be treated by using ear drops to soften the earwax or by having the earwax removed by a health care provider.  Do   not put any objects, including cotton swabs, into your ear. You can clean the opening of your ear canal with a washcloth or facial tissue. This information is not intended to replace advice given to you by your health care provider. Make sure you discuss any questions you have with your health care provider. Document Released: 06/15/2004 Document Revised: 07/19/2016 Document Reviewed: 07/19/2016 Elsevier Interactive Patient Education  2018 [employer]vier Inc[employer]  

## 2016-12-28 NOTE — Progress Notes (Signed)
Patient: Rachel Ali Female    DOB: 1961/03/10   56 y.o.   MRN: [id number] Visit Date: 12/28/2016  Today's Provider: Lavon Paganini, MD   Chief Complaint  Patient presents with  . Otalgia   Subjective:    Otalgia   There is pain in both ears. This is a new problem. Episode onset: x 6 days. The problem has been waxing and waning. There has been no fever. The pain is at a severity of 5/10. The pain is moderate. Associated symptoms include abdominal pain (at onset; has since improved), headaches, hearing loss (right ear, improved last night, not currently present) and neck pain (anterior, mostly jaw pain). Pertinent negatives include no coughing, diarrhea, ear discharge, rash, rhinorrhea, sore throat or vomiting. Associated symptoms comments: Nasal congestion and feeling off balance are present. She has tried NSAIDs for the symptoms. The treatment provided moderate relief.      Allergies  Allergen Reactions  . Cinnamon     mild Trouble breathing.  Marland Kitchen Penicillins     Itching     Current Outpatient Prescriptions:  .  albuterol (PROVENTIL HFA;VENTOLIN HFA) 108 (90 Base) MCG/ACT inhaler, Inhale 1-2 puffs into the lungs every 6 (six) hours as needed for wheezing or shortness of breath., Disp: , Rfl:  .  azelastine (ASTELIN) 0.1 % nasal spray, Place 2 sprays into both nostrils 2 (two) times daily., Disp: 90 mL, Rfl: 1 .  Biotin 1000 MCG CHEW, Chew by mouth., Disp: , Rfl:  .  Cholecalciferol 1000 units capsule, Take by mouth., Disp: , Rfl:  .  diphenhydrAMINE (SOMINEX) 25 MG tablet, Take 25 mg by mouth at bedtime as needed for sleep., Disp: , Rfl:  .  FLAXSEED, LINSEED, PO, Take by mouth., Disp: , Rfl:  .  Ibuprofen 200 MG CAPS, Take by mouth., Disp: , Rfl:  .  lansoprazole (PREVACID) 15 MG capsule, Take 15 mg by mouth as needed., Disp: , Rfl:  .  Multiple Vitamin tablet, Take by mouth., Disp: , Rfl:  .  sertraline (ZOLOFT) 50 MG tablet, TAKE 1 TABLET BY MOUTH  DAILY, Disp: 90 tablet,  Rfl: 1 .  traZODone (DESYREL) 50 MG tablet, Take 1 tablet (50 mg total) by mouth at bedtime., Disp: 90 tablet, Rfl: 1 .  triamcinolone (NASACORT ALLERGY 24HR) 55 MCG/ACT AERO nasal inhaler, Place 2 sprays into the nose daily., Disp: , Rfl:  .  baclofen (LIORESAL) 10 MG tablet, Take 1 tablet (10 mg total) by mouth at bedtime as needed for muscle spasms. (Patient not taking: Reported on 11/20/2016), Disp: 90 tablet, Rfl: 1 .  carbamide peroxide (DEBROX) 6.5 % OTIC solution, Place 5 drops into both ears 2 (two) times daily., Disp: 15 mL, Rfl: 1  Review of Systems  HENT: Positive for ear pain and hearing loss (right ear, improved last night, not currently present). Negative for ear discharge, rhinorrhea and sore throat.   Respiratory: Negative for cough.   Gastrointestinal: Positive for abdominal pain (at onset; has since improved). Negative for diarrhea and vomiting.  Musculoskeletal: Positive for neck pain (anterior, mostly jaw pain).  Skin: Negative for rash.  Neurological: Positive for headaches.    Social History  Substance Use Topics  . Smoking status: Former Smoker    Packs/day: 0.50    Years: 3.00    Quit date: 05/22/1993  . Smokeless tobacco: Never Used  . Alcohol use No     Comment: rare   Objective:   BP 112/76 (BP Location: Left  Arm, Patient Position: Sitting, Cuff Size: Normal)   Pulse 84   Temp (!) 97.5 F (36.4 C) (Oral)   Resp 16   Wt 162 lb (73.5 kg)   BMI 27.81 kg/m  Vitals:   12/28/16 0812  BP: 112/76  Pulse: 84  Resp: 16  Temp: (!) 97.5 F (36.4 C)  TempSrc: Oral  Weight: 162 lb (73.5 kg)     Physical Exam  Constitutional: She is oriented to person, place, and time. She appears well-developed and well-nourished. No distress.  HENT:  Head: Normocephalic and atraumatic.  Right Ear: External ear normal.  Left Ear: External ear normal.  Nose: Nose normal.  Mouth/Throat: Oropharynx is clear and moist. No oropharyngeal exudate.  Cerumen impaction of b/l ear  canals TTP over TMJ on R side  Eyes: Pupils are equal, round, and reactive to light. Conjunctivae are normal. Right eye exhibits no discharge. Left eye exhibits no discharge.  Neck: Normal range of motion. Neck supple. No thyromegaly present.  Cardiovascular: Normal rate, regular rhythm and normal heart sounds.   No murmur heard. Pulmonary/Chest: Effort normal and breath sounds normal. No respiratory distress.  Musculoskeletal: She exhibits no edema or deformity.  Lymphadenopathy:    She has no cervical adenopathy.  Neurological: She is alert and oriented to person, place, and time.  Skin: Skin is warm and dry. No rash noted.  Psychiatric: She has a normal mood and affect. Her behavior is normal.  Vitals reviewed.       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

## 2016-12-28 NOTE — Assessment & Plan Note (Addendum)
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

## 2017-01-15 ENCOUNTER — Telehealth: Payer: Self-pay | Admitting: Physician Assistant

## 2017-01-15 DIAGNOSIS — F5101 Primary insomnia: Secondary | ICD-10-CM

## 2017-01-15 MED ORDER — TRAZODONE HCL 50 MG PO TABS: 50.0000 mg | ORAL_TABLET | Freq: Every day | ORAL | 1 refills | Status: DC

## 2017-01-15 NOTE — Telephone Encounter (Signed)
Refilled

## 2017-01-15 NOTE — Telephone Encounter (Signed)
[facility] faxed a request on the following medication. Thanks CC  traZODone (DESYREL) 50 MG tablet

## 2017-02-24 ENCOUNTER — Ambulatory Visit (INDEPENDENT_AMBULATORY_CARE_PROVIDER_SITE_OTHER): Payer: 59 | Admitting: Physician Assistant

## 2017-02-24 DIAGNOSIS — Z23 Encounter for immunization: Secondary | ICD-10-CM | POA: Diagnosis not present

## 2017-03-26 ENCOUNTER — Other Ambulatory Visit: Payer: Self-pay | Admitting: Physician Assistant

## 2017-03-26 DIAGNOSIS — F419 Anxiety disorder, unspecified: Secondary | ICD-10-CM

## 2017-06-02 ENCOUNTER — Other Ambulatory Visit: Payer: Self-pay | Admitting: Physician Assistant

## 2017-06-02 DIAGNOSIS — F5101 Primary insomnia: Secondary | ICD-10-CM

## 2017-07-22 ENCOUNTER — Other Ambulatory Visit: Payer: Self-pay | Admitting: Physician Assistant

## 2017-07-22 DIAGNOSIS — F419 Anxiety disorder, unspecified: Secondary | ICD-10-CM

## 2017-11-30 ENCOUNTER — Ambulatory Visit (INDEPENDENT_AMBULATORY_CARE_PROVIDER_SITE_OTHER): Payer: 59 | Admitting: Physician Assistant

## 2017-11-30 ENCOUNTER — Encounter: Payer: Self-pay | Admitting: Physician Assistant

## 2017-11-30 VITALS — BP 160/90 | HR 96 | Temp 98.5°F | Resp 16 | Ht 64.0 in | Wt 176.6 lb

## 2017-11-30 DIAGNOSIS — Z1329 Encounter for screening for other suspected endocrine disorder: Secondary | ICD-10-CM

## 2017-11-30 DIAGNOSIS — R5383 Other fatigue: Secondary | ICD-10-CM | POA: Diagnosis not present

## 2017-11-30 DIAGNOSIS — R739 Hyperglycemia, unspecified: Secondary | ICD-10-CM | POA: Diagnosis not present

## 2017-11-30 DIAGNOSIS — Z1231 Encounter for screening mammogram for malignant neoplasm of breast: Secondary | ICD-10-CM

## 2017-11-30 DIAGNOSIS — Z0000 Encounter for general adult medical examination without abnormal findings: Secondary | ICD-10-CM

## 2017-11-30 DIAGNOSIS — R635 Abnormal weight gain: Secondary | ICD-10-CM

## 2017-11-30 DIAGNOSIS — Z6830 Body mass index (BMI) 30.0-30.9, adult: Secondary | ICD-10-CM

## 2017-11-30 NOTE — Progress Notes (Signed)
Patient: Rachel Ali, Female    DOB: 14-Dec-1960, 57 y.o.   MRN: [id number] Visit Date: 11/30/2017  Today's Provider: Mar Daring, PA-C   Chief Complaint  Patient presents with  . Annual Exam   Subjective:    Annual physical exam Rachel ANASTAS is a 57 y.o. female who presents today for health maintenance and complete physical. She feels well. She reports exercising. She reports she is sleeping well.  Last CPE:11/20/16 Pap:11/20/16- normal, HPV-negative Mammo: 11/25/15- BiRads:1  -----------------------------------------------------------------  Review of Systems  Constitutional: Positive for fatigue and unexpected weight change.  HENT: Positive for sinus pressure.   Eyes: Negative.   Respiratory: Positive for shortness of breath.   Cardiovascular: Negative.   Gastrointestinal: Negative.   Endocrine: Negative.   Genitourinary: Negative.   Musculoskeletal: Negative.   Skin: Negative.   Allergic/Immunologic: Negative.   Neurological: Negative.   Hematological: Negative.   Psychiatric/Behavioral: Negative.     Social History      She  reports that she quit smoking about 24 years ago. She has a 1.50 pack-year smoking history. She has never used smokeless tobacco. She reports that she does not drink alcohol or use drugs.       Social History   Socioeconomic History  . Marital status: Married    Spouse name: Magda Paganini  . Number of children: 1  . Years of education: Associates  . Highest education level: Not on file  Occupational History    Employer: [employer]  . Financial resource strain: Not on file  . Food insecurity:    Worry: Not on file    Inability: Not on file  . Transportation needs:    Medical: Not on file    Non-medical: Not on file  Tobacco Use  . Smoking status: Former Smoker    Packs/day: 0.50    Years: 3.00    Pack years: 1.50    Last attempt to quit: 05/22/1993    Years since quitting: 24.5  . Smokeless tobacco: Never Used    Substance and Sexual Activity  . Alcohol use: No    Comment: rare  . Drug use: No  . Sexual activity: Yes    Birth control/protection: Post-menopausal  Lifestyle  . Physical activity:    Days per week: Not on file    Minutes per session: Not on file  . Stress: Not on file  Relationships  . Social connections:    Talks on phone: Not on file    Gets together: Not on file    Attends religious service: Not on file    Active member of club or organization: Not on file    Attends meetings of clubs or organizations: Not on file    Relationship status: Not on file  Other Topics Concern  . Not on file  Social History Narrative  . Not on file    Past Medical History:  Diagnosis Date  . Allergy    sinus problem     Patient Active Problem List   Diagnosis Date Noted  . Otalgia of both ears 12/28/2016  . Low back pain 11/17/2015  . Allergic rhinitis 07/01/2015  . Anxiety 07/01/2015  . BMI 28.0-28.9,adult 07/01/2015  . Blood glucose elevated 07/01/2015  . Climacteric 07/01/2015  . Osteopenia 07/01/2015  . Hemorrhage, postmenopausal 07/01/2015  . Insomnia 12/10/2014    Past Surgical History:  Procedure Laterality Date  . DILATION AND CURETTAGE OF UTERUS    .  ESSURE TUBAL LIGATION    . OTHER SURGICAL HISTORY     coil placement in tubes to prevent pregnancy-patient could not recall the name of the procedure  . TONSILLECTOMY      Family History        Family Status  Relation Name Status  . Mother  Alive  . Father  Deceased at age 14  . Brother  Alive  . MGM  Deceased at age 49  . MGF  Deceased  . PGM  Deceased at age 10  . PGF  Deceased        Her family history includes Cancer in her father; Congestive Heart Failure in her maternal grandmother and paternal grandmother; Heart disease in her father and maternal grandfather; Hypertension in her brother, father, and mother; Stroke in her paternal grandfather; Uterine cancer in her mother.      Allergies  Allergen  Reactions  . Cinnamon     mild Trouble breathing.  Marland Kitchen Penicillins     Itching     Current Outpatient Medications:  .  Aloe Vera GEL, Apply topically., Disp: , Rfl:  .  azelastine (ASTELIN) 0.1 % nasal spray, Place 2 sprays into both nostrils 2 (two) times daily., Disp: 90 mL, Rfl: 1 .  Biotin 1000 MCG CHEW, Chew by mouth., Disp: , Rfl:  .  Cholecalciferol 1000 units capsule, Take by mouth., Disp: , Rfl:  .  FLAXSEED, LINSEED, PO, Take by mouth., Disp: , Rfl:  .  lansoprazole (PREVACID) 15 MG capsule, Take 15 mg by mouth as needed., Disp: , Rfl:  .  Multiple Vitamin tablet, Take by mouth., Disp: , Rfl:  .  sertraline (ZOLOFT) 50 MG tablet, TAKE 1 TABLET BY MOUTH  DAILY, Disp: 90 tablet, Rfl: 1 .  traZODone (DESYREL) 50 MG tablet, TAKE 1 TABLET BY MOUTH AT  BEDTIME, Disp: 90 tablet, Rfl: 1 .  triamcinolone (NASACORT ALLERGY 24HR) 55 MCG/ACT AERO nasal inhaler, Place 2 sprays into the nose daily., Disp: , Rfl:  .  albuterol (PROVENTIL HFA;VENTOLIN HFA) 108 (90 Base) MCG/ACT inhaler, Inhale 1-2 puffs into the lungs every 6 (six) hours as needed for wheezing or shortness of breath., Disp: , Rfl:  .  baclofen (LIORESAL) 10 MG tablet, Take 1 tablet (10 mg total) by mouth at bedtime as needed for muscle spasms. (Patient not taking: Reported on 11/20/2016), Disp: 90 tablet, Rfl: 1 .  diphenhydrAMINE (SOMINEX) 25 MG tablet, Take 25 mg by mouth at bedtime as needed for sleep., Disp: , Rfl:  .  Ibuprofen 200 MG CAPS, Take by mouth., Disp: , Rfl:    Patient Care Team: Mar Daring, PA-C as PCP - General (Family Medicine)      Objective:   Vitals: BP (!) 160/90 (BP Location: Left Arm, Patient Position: Sitting, Cuff Size: Large)   Pulse 96   Temp 98.5 F (36.9 C) (Oral)   Resp 16   Ht 5\' 4"  (1.626 m)   Wt 176 lb 9.6 oz (80.1 kg)   BMI 30.31 kg/m    Vitals:   11/30/17 1555  BP: (!) 160/90  Pulse: 96  Resp: 16  Temp: 98.5 F (36.9 C)  TempSrc: Oral  Weight: 176 lb 9.6 oz (80.1  kg)  Height: 5\' 4"  (1.626 m)     Physical Exam  Constitutional: She is oriented to person, place, and time. She appears well-developed and well-nourished. No distress.  HENT:  Head: Normocephalic and atraumatic.  Right Ear: Hearing, tympanic membrane, external ear  and ear canal normal.  Left Ear: Hearing, tympanic membrane, external ear and ear canal normal.  Nose: Nose normal.  Mouth/Throat: Uvula is midline, oropharynx is clear and moist and mucous membranes are normal. No oropharyngeal exudate.  Eyes: Pupils are equal, round, and reactive to light. Conjunctivae and EOM are normal. Right eye exhibits no discharge. Left eye exhibits no discharge. No scleral icterus.  Neck: Normal range of motion. Neck supple. No JVD present. Carotid bruit is not present. No tracheal deviation present. No thyromegaly present.  Cardiovascular: Normal rate, regular rhythm, normal heart sounds and intact distal pulses. Exam reveals no gallop and no friction rub.  No murmur heard. Pulmonary/Chest: Effort normal and breath sounds normal. No respiratory distress. She has no wheezes. She has no rales. She exhibits no tenderness. Right breast exhibits no inverted nipple, no mass, no nipple discharge, no skin change and no tenderness. Left breast exhibits no inverted nipple, no mass, no nipple discharge, no skin change and no tenderness. No breast swelling, tenderness, discharge or bleeding. Breasts are symmetrical.  Abdominal: Soft. Bowel sounds are normal. She exhibits no distension and no mass. There is no tenderness. There is no rebound and no guarding.  Musculoskeletal: Normal range of motion. She exhibits no edema or tenderness.  Lymphadenopathy:    She has no cervical adenopathy.  Neurological: She is alert and oriented to person, place, and time.  Skin: Skin is warm and dry. No rash noted. She is not diaphoretic.  Psychiatric: She has a normal mood and affect. Her behavior is normal. Judgment and thought  content normal.  Vitals reviewed.    Depression Screen PHQ 2/9 Scores 11/30/2017 11/20/2016 11/17/2015  PHQ - 2 Score 1 0 0  PHQ- 9 Score 3 4 -      Assessment & Plan:     Routine Health Maintenance and Physical Exam  Exercise Activities and Dietary recommendations Goals    None      Immunization History  Administered Date(s) Administered  . Influenza,inj,Quad PF,6+ Mos 03/13/2015, 03/18/2016, 02/24/2017    Health Maintenance  Topic Date Due  . TETANUS/TDAP  10/24/1980  . COLONOSCOPY  10/25/2011  . MAMMOGRAM  11/24/2016  . INFLUENZA VACCINE  12/20/2017  . PAP SMEAR  11/21/2019  . Hepatitis C Screening  Completed  . HIV Screening  Completed     Discussed health benefits of physical activity, and encouraged her to engage in regular exercise appropriate for her age and condition.    1. Annual physical exam Normal physical exam today. Will check labs as below and f/u pending lab results. If labs are stable and WNL she will not need to have these rechecked for one year at her next annual physical exam. She is to call the office in the meantime if she has any acute issue, questions or concerns. - CBC with Differential/Platelet - Comprehensive metabolic panel - Hemoglobin A1c - Lipid panel - TSH  2. Encounter for screening mammogram for breast cancer Breast exam today was normal. There is no family history of breast cancer. She does perform regular self breast exams. Mammogram was ordered as below. Information for [facility] was given to patient so she may schedule her mammogram at her convenience. - MM DIGITAL SCREENING BILATERAL; Future  3. Blood glucose elevated H/O this. Diet controlled. Will check labs as below and f/u pending results. - Hemoglobin A1c - Lipid panel  4. Fatigue, unspecified type Will check labs as below and f/u pending results. - CBC with Differential/Platelet -  B12 - Vitamin D (25 hydroxy)  5. Screening for thyroid  disorder Will check labs as below and f/u pending results. - TSH  6. Weight gain Counseled patient on healthy lifestyle modifications including dieting and exercise. Will check labs as below and f/u pending results. - B12 - Vitamin D (25 hydroxy)  7. BMI 30.0-30.9,adult Counseled patient on healthy lifestyle modifications including dieting and exercise.   --------------------------------------------------------------------    Mar Daring, PA-C  [facility]

## 2017-11-30 NOTE — Patient Instructions (Signed)
Health Maintenance for Postmenopausal Women Menopause is a normal process in which your reproductive ability comes to an end. This process happens gradually over a span of months to years, usually between the ages of 22 and 9. Menopause is complete when you have missed 12 consecutive menstrual periods. It is important to talk with your health care provider about some of the most common conditions that affect postmenopausal women, such as heart disease, cancer, and bone loss (osteoporosis). Adopting a healthy lifestyle and getting preventive care can help to promote your health and wellness. Those actions can also lower your chances of developing some of these common conditions. What should I know about menopause? During menopause, you may experience a number of symptoms, such as:  Moderate-to-severe hot flashes.  Night sweats.  Decrease in sex drive.  Mood swings.  Headaches.  Tiredness.  Irritability.  Memory problems.  Insomnia.  Choosing to treat or not to treat menopausal changes is an individual decision that you make with your health care provider. What should I know about hormone replacement therapy and supplements? Hormone therapy products are effective for treating symptoms that are associated with menopause, such as hot flashes and night sweats. Hormone replacement carries certain risks, especially as you become older. If you are thinking about using estrogen or estrogen with progestin treatments, discuss the benefits and risks with your health care provider. What should I know about heart disease and stroke? Heart disease, heart attack, and stroke become more likely as you age. This may be due, in part, to the hormonal changes that your body experiences during menopause. These can affect how your body processes dietary fats, triglycerides, and cholesterol. Heart attack and stroke are both medical emergencies. There are many things that you can do to help prevent heart disease  and stroke:  Have your blood pressure checked at least every 1-2 years. High blood pressure causes heart disease and increases the risk of stroke.  If you are 53-22 years old, ask your health care provider if you should take aspirin to prevent a heart attack or a stroke.  Do not use any tobacco products, including cigarettes, chewing tobacco, or electronic cigarettes. If you need help quitting, ask your health care provider.  It is important to eat a healthy diet and maintain a healthy weight. ? Be sure to include plenty of vegetables, fruits, low-fat dairy products, and lean protein. ? Avoid eating foods that are high in solid fats, added sugars, or salt (sodium).  Get regular exercise. This is one of the most important things that you can do for your health. ? Try to exercise for at least 150 minutes each week. The type of exercise that you do should increase your heart rate and make you sweat. This is known as moderate-intensity exercise. ? Try to do strengthening exercises at least twice each week. Do these in addition to the moderate-intensity exercise.  Know your numbers.Ask your health care provider to check your cholesterol and your blood glucose. Continue to have your blood tested as directed by your health care provider.  What should I know about cancer screening? There are several types of cancer. Take the following steps to reduce your risk and to catch any cancer development as early as possible. Breast Cancer  Practice breast self-awareness. ? This means understanding how your breasts normally appear and feel. ? It also means doing regular breast self-exams. Let your health care provider know about any changes, no matter how small.  If you are 40  or older, have a clinician do a breast exam (clinical breast exam or CBE) every year. Depending on your age, family history, and medical history, it may be recommended that you also have a yearly breast X-ray (mammogram).  If you  have a family history of breast cancer, talk with your health care provider about genetic screening.  If you are at high risk for breast cancer, talk with your health care provider about having an MRI and a mammogram every year.  Breast cancer (BRCA) gene test is recommended for women who have family members with BRCA-related cancers. Results of the assessment will determine the need for genetic counseling and BRCA1 and for BRCA2 testing. BRCA-related cancers include these types: ? Breast. This occurs in males or females. ? Ovarian. ? Tubal. This may also be called fallopian tube cancer. ? Cancer of the abdominal or pelvic lining (peritoneal cancer). ? Prostate. ? Pancreatic.  Cervical, Uterine, and Ovarian Cancer Your health care provider may recommend that you be screened regularly for cancer of the pelvic organs. These include your ovaries, uterus, and vagina. This screening involves a pelvic exam, which includes checking for microscopic changes to the surface of your cervix (Pap test).  For women ages 21-65, health care providers may recommend a pelvic exam and a Pap test every three years. For women ages 79-65, they may recommend the Pap test and pelvic exam, combined with testing for human papilloma virus (HPV), every five years. Some types of HPV increase your risk of cervical cancer. Testing for HPV may also be done on women of any age who have unclear Pap test results.  Other health care providers may not recommend any screening for nonpregnant women who are considered low risk for pelvic cancer and have no symptoms. Ask your health care provider if a screening pelvic exam is right for you.  If you have had past treatment for cervical cancer or a condition that could lead to cancer, you need Pap tests and screening for cancer for at least 20 years after your treatment. If Pap tests have been discontinued for you, your risk factors (such as having a new sexual partner) need to be  reassessed to determine if you should start having screenings again. Some women have medical problems that increase the chance of getting cervical cancer. In these cases, your health care provider may recommend that you have screening and Pap tests more often.  If you have a family history of uterine cancer or ovarian cancer, talk with your health care provider about genetic screening.  If you have vaginal bleeding after reaching menopause, tell your health care provider.  There are currently no reliable tests available to screen for ovarian cancer.  Lung Cancer Lung cancer screening is recommended for adults 69-62 years old who are at high risk for lung cancer because of a history of smoking. A yearly low-dose CT scan of the lungs is recommended if you:  Currently smoke.  Have a history of at least 30 pack-years of smoking and you currently smoke or have quit within the past 15 years. A pack-year is smoking an average of one pack of cigarettes per day for one year.  Yearly screening should:  Continue until it has been 15 years since you quit.  Stop if you develop a health problem that would prevent you from having lung cancer treatment.  Colorectal Cancer  This type of cancer can be detected and can often be prevented.  Routine colorectal cancer screening usually begins at  age 42 and continues through age 45.  If you have risk factors for colon cancer, your health care provider may recommend that you be screened at an earlier age.  If you have a family history of colorectal cancer, talk with your health care provider about genetic screening.  Your health care provider may also recommend using home test kits to check for hidden blood in your stool.  A small camera at the end of a tube can be used to examine your colon directly (sigmoidoscopy or colonoscopy). This is done to check for the earliest forms of colorectal cancer.  Direct examination of the colon should be repeated every  5-10 years until age 71. However, if early forms of precancerous polyps or small growths are found or if you have a family history or genetic risk for colorectal cancer, you may need to be screened more often.  Skin Cancer  Check your skin from head to toe regularly.  Monitor any moles. Be sure to tell your health care provider: ? About any new moles or changes in moles, especially if there is a change in a mole's shape or color. ? If you have a mole that is larger than the size of a pencil eraser.  If any of your family members has a history of skin cancer, especially at a young age, talk with your health care provider about genetic screening.  Always use sunscreen. Apply sunscreen liberally and repeatedly throughout the day.  Whenever you are outside, protect yourself by wearing long sleeves, pants, a wide-brimmed hat, and sunglasses.  What should I know about osteoporosis? Osteoporosis is a condition in which bone destruction happens more quickly than new bone creation. After menopause, you may be at an increased risk for osteoporosis. To help prevent osteoporosis or the bone fractures that can happen because of osteoporosis, the following is recommended:  If you are 46-71 years old, get at least 1,000 mg of calcium and at least 600 mg of vitamin D per day.  If you are older than age 55 but younger than age 65, get at least 1,200 mg of calcium and at least 600 mg of vitamin D per day.  If you are older than age 54, get at least 1,200 mg of calcium and at least 800 mg of vitamin D per day.  Smoking and excessive alcohol intake increase the risk of osteoporosis. Eat foods that are rich in calcium and vitamin D, and do weight-bearing exercises several times each week as directed by your health care provider. What should I know about how menopause affects my mental health? Depression may occur at any age, but it is more common as you become older. Common symptoms of depression  include:  Low or sad mood.  Changes in sleep patterns.  Changes in appetite or eating patterns.  Feeling an overall lack of motivation or enjoyment of activities that you previously enjoyed.  Frequent crying spells.  Talk with your health care provider if you think that you are experiencing depression. What should I know about immunizations? It is important that you get and maintain your immunizations. These include:  Tetanus, diphtheria, and pertussis (Tdap) booster vaccine.  Influenza every year before the flu season begins.  Pneumonia vaccine.  Shingles vaccine.  Your health care provider may also recommend other immunizations. This information is not intended to replace advice given to you by your health care provider. Make sure you discuss any questions you have with your health care provider. Document Released: 06/30/2005  Document Revised: 11/26/2015 Document Reviewed: 02/09/2015 Elsevier Interactive Patient Education  2018 [employer]  

## 2017-12-01 LAB — CBC WITH DIFFERENTIAL/PLATELET
Basophils Absolute: 0 10*3/uL (ref 0.0–0.2)
Basos: 1 %
EOS (ABSOLUTE): 0.1 10*3/uL (ref 0.0–0.4)
Eos: 2 %
Hematocrit: 37.9 % (ref 34.0–46.6)
Hemoglobin: 12.8 g/dL (ref 11.1–15.9)
Immature Grans (Abs): 0 10*3/uL (ref 0.0–0.1)
Immature Granulocytes: 0 %
Lymphocytes Absolute: 1.6 10*3/uL (ref 0.7–3.1)
Lymphs: 29 %
MCH: 31.6 pg (ref 26.6–33.0)
MCHC: 33.8 g/dL (ref 31.5–35.7)
MCV: 94 fL (ref 79–97)
Monocytes Absolute: 0.4 10*3/uL (ref 0.1–0.9)
Monocytes: 8 %
Neutrophils Absolute: 3.3 10*3/uL (ref 1.4–7.0)
Neutrophils: 60 %
Platelets: 268 10*3/uL (ref 150–450)
RBC: 4.05 x10E6/uL (ref 3.77–5.28)
RDW: 12.4 % (ref 12.3–15.4)
WBC: 5.5 10*3/uL (ref 3.4–10.8)

## 2017-12-01 LAB — VITAMIN B12: Vitamin B-12: 827 pg/mL (ref 232–1245)

## 2017-12-01 LAB — COMPREHENSIVE METABOLIC PANEL
ALT: 37 IU/L — ABNORMAL HIGH (ref 0–32)
AST: 31 IU/L (ref 0–40)
Albumin/Globulin Ratio: 1.9 (ref 1.2–2.2)
Albumin: 4.5 g/dL (ref 3.5–5.5)
Alkaline Phosphatase: 128 IU/L — ABNORMAL HIGH (ref 39–117)
BUN/Creatinine Ratio: 14 (ref 9–23)
BUN: 11 mg/dL (ref 6–24)
Bilirubin Total: <0.2 mg/dL (ref 0.0–1.2)
CO2: 26 mmol/L (ref 20–29)
Calcium: 9.8 mg/dL (ref 8.7–10.2)
Chloride: 102 mmol/L (ref 96–106)
Creatinine, Ser: 0.77 mg/dL (ref 0.57–1.00)
GFR calc Af Amer: 100 mL/min/{1.73_m2} (ref >59)
GFR calc non Af Amer: 87 mL/min/{1.73_m2} (ref >59)
Globulin, Total: 2.4 g/dL (ref 1.5–4.5)
Glucose: 92 mg/dL (ref 65–99)
Potassium: 3.9 mmol/L (ref 3.5–5.2)
Sodium: 141 mmol/L (ref 134–144)
Total Protein: 6.9 g/dL (ref 6.0–8.5)

## 2017-12-01 LAB — HEMOGLOBIN A1C
Est. average glucose Bld gHb Est-mCnc: 120 mg/dL
Hgb A1c MFr Bld: 5.8 % — ABNORMAL HIGH (ref 4.8–5.6)

## 2017-12-01 LAB — LIPID PANEL
Chol/HDL Ratio: 4.3 ratio (ref 0.0–4.4)
Cholesterol, Total: 219 mg/dL — ABNORMAL HIGH (ref 100–199)
HDL: 51 mg/dL (ref >39)
LDL Calculated: 113 mg/dL — ABNORMAL HIGH (ref 0–99)
Triglycerides: 276 mg/dL — ABNORMAL HIGH (ref 0–149)
VLDL Cholesterol Cal: 55 mg/dL — ABNORMAL HIGH (ref 5–40)

## 2017-12-01 LAB — TSH: TSH: 1.29 u[IU]/mL (ref 0.450–4.500)

## 2017-12-01 LAB — VITAMIN D 25 HYDROXY (VIT D DEFICIENCY, FRACTURES): Vit D, 25-Hydroxy: 29.6 ng/mL — ABNORMAL LOW (ref 30.0–100.0)

## 2017-12-03 ENCOUNTER — Telehealth: Payer: Self-pay

## 2017-12-03 NOTE — Telephone Encounter (Signed)
Patient advised as directed below.  Thanks,  -Joseline 

## 2017-12-03 NOTE — Telephone Encounter (Signed)
-----   Message from Mar Daring, PA-C sent at 12/03/2017  9:10 AM EDT ----- Blood count is normal. Kidney function is normal. Liver enzymes are stable. Electrolytes are normal. A1c is up to 5.8. Cholesterol also up. Work on lifestyle modifications with healthy dieting and exercise. B12 is normal. Vit D is borderline low at 29.6. Recommend OTC Vit D supplement of 1000 IU daily.

## 2017-12-06 ENCOUNTER — Telehealth: Payer: Self-pay

## 2017-12-06 NOTE — Telephone Encounter (Signed)
LM that her health screening form is ready for pick up.  Thanks,  -Nkechi Linehan

## 2017-12-25 ENCOUNTER — Ambulatory Visit
Admission: RE | Admit: 2017-12-25 | Discharge: 2017-12-25 | Disposition: A | Discharge status: Home / Self Care | Payer: 59 | Source: Ambulatory Visit | Attending: Physician Assistant | Admitting: Physician Assistant

## 2017-12-25 DIAGNOSIS — Z1231 Encounter for screening mammogram for malignant neoplasm of breast: Secondary | ICD-10-CM | POA: Insufficient documentation

## 2017-12-28 ENCOUNTER — Telehealth: Payer: Self-pay

## 2017-12-28 NOTE — Telephone Encounter (Signed)
LMTCB  Thanks,  -Joseline 

## 2017-12-28 NOTE — Telephone Encounter (Signed)
-----   Message from Mar Daring, PA-C sent at 12/28/2017 10:41 AM EDT ----- Normal mammogram. Repeat screening in one year.

## 2017-12-28 NOTE — Telephone Encounter (Signed)
Advised patient of results.  

## 2018-01-08 DIAGNOSIS — L812 Freckles: Secondary | ICD-10-CM | POA: Diagnosis not present

## 2018-01-08 DIAGNOSIS — Z1283 Encounter for screening for malignant neoplasm of skin: Secondary | ICD-10-CM | POA: Diagnosis not present

## 2018-01-08 DIAGNOSIS — D485 Neoplasm of uncertain behavior of skin: Secondary | ICD-10-CM | POA: Diagnosis not present

## 2018-01-08 DIAGNOSIS — L820 Inflamed seborrheic keratosis: Secondary | ICD-10-CM | POA: Diagnosis not present

## 2018-01-08 DIAGNOSIS — L821 Other seborrheic keratosis: Secondary | ICD-10-CM | POA: Diagnosis not present

## 2018-01-08 DIAGNOSIS — D2239 Melanocytic nevi of other parts of face: Secondary | ICD-10-CM | POA: Diagnosis not present

## 2018-01-26 ENCOUNTER — Other Ambulatory Visit: Payer: Self-pay | Admitting: Physician Assistant

## 2018-01-26 DIAGNOSIS — F419 Anxiety disorder, unspecified: Secondary | ICD-10-CM

## 2018-02-20 ENCOUNTER — Ambulatory Visit: Payer: 59 | Admitting: Physician Assistant

## 2018-02-20 ENCOUNTER — Encounter: Payer: Self-pay | Admitting: Physician Assistant

## 2018-02-20 ENCOUNTER — Other Ambulatory Visit: Payer: Self-pay

## 2018-02-20 ENCOUNTER — Telehealth: Payer: Self-pay | Admitting: Physician Assistant

## 2018-02-20 VITALS — BP 122/88 | HR 87 | Temp 97.9°F | Ht 64.5 in | Wt 179.8 lb

## 2018-02-20 DIAGNOSIS — R1011 Right upper quadrant pain: Secondary | ICD-10-CM

## 2018-02-20 DIAGNOSIS — Z2821 Immunization not carried out because of patient refusal: Secondary | ICD-10-CM | POA: Diagnosis not present

## 2018-02-20 DIAGNOSIS — M19041 Primary osteoarthritis, right hand: Secondary | ICD-10-CM | POA: Diagnosis not present

## 2018-02-20 DIAGNOSIS — J4520 Mild intermittent asthma, uncomplicated: Secondary | ICD-10-CM | POA: Diagnosis not present

## 2018-02-20 MED ORDER — ALBUTEROL SULFATE HFA 108 (90 BASE) MCG/ACT IN AERS: 1.0000 | INHALATION_SPRAY | Freq: Four times a day (QID) | RESPIRATORY_TRACT | 11 refills | Status: DC | PRN

## 2018-02-20 MED ORDER — NAPROXEN 500 MG PO TBEC: 500.0000 mg | DELAYED_RELEASE_TABLET | Freq: Two times a day (BID) | ORAL | 5 refills | Status: DC

## 2018-02-20 NOTE — Telephone Encounter (Signed)
FYI

## 2018-02-20 NOTE — Patient Instructions (Signed)

## 2018-02-20 NOTE — Progress Notes (Signed)
Patient: Rachel Ali Female    DOB: 1960-11-27   57 y.o.   MRN: [id number] Visit Date: 02/20/2018  Today's Provider: Mar Daring, PA-C   Chief Complaint  Patient presents with  . pain in right hand   Subjective:    HPI pain in right hand started Friday.  No injury that she can recall. Reports noticing pain in the MTP joints of fingers 2-5 on the right hand with some mild swelling. No discoloration. Started at work, she uses her hands a lot at work. Pain intense enough to make her tear up at work due to it. She started taking extra strength tylenol 650mg  every 8 hrs and using tiger balm ointment. Pain is improved today.   Also c/o having a pain in mid back that she thinks that comes from eating greasy foods and popcorn, the pain will bend her over double. She has had this before with fatty foods. Reports it occurs once every 2-3 months.      Allergies  Allergen Reactions  . Cinnamon     mild Trouble breathing.  Marland Kitchen Penicillins     Itching     Current Outpatient Medications:  .  albuterol (PROVENTIL HFA;VENTOLIN HFA) 108 (90 Base) MCG/ACT inhaler, Inhale 1-2 puffs into the lungs every 6 (six) hours as needed for wheezing or shortness of breath., Disp: , Rfl:  .  Aloe Vera GEL, Apply topically., Disp: , Rfl:  .  azelastine (ASTELIN) 0.1 % nasal spray, Place 2 sprays into both nostrils 2 (two) times daily., Disp: 90 mL, Rfl: 1 .  Cholecalciferol 1000 units capsule, Take by mouth., Disp: , Rfl:  .  diphenhydrAMINE (SOMINEX) 25 MG tablet, Take 25 mg by mouth at bedtime as needed for sleep., Disp: , Rfl:  .  FLAXSEED, LINSEED, PO, Take by mouth., Disp: , Rfl:  .  Ibuprofen 200 MG CAPS, Take by mouth., Disp: , Rfl:  .  lansoprazole (PREVACID) 15 MG capsule, Take 15 mg by mouth as needed., Disp: , Rfl:  .  Multiple Vitamin tablet, Take by mouth., Disp: , Rfl:  .  sertraline (ZOLOFT) 50 MG tablet, TAKE 1 TABLET BY MOUTH  DAILY, Disp: 90 tablet, Rfl: 1 .  traZODone (DESYREL) 50  MG tablet, TAKE 1 TABLET BY MOUTH AT  BEDTIME, Disp: 90 tablet, Rfl: 1 .  triamcinolone (NASACORT ALLERGY 24HR) 55 MCG/ACT AERO nasal inhaler, Place 2 sprays into the nose daily., Disp: , Rfl:  .  baclofen (LIORESAL) 10 MG tablet, Take 1 tablet (10 mg total) by mouth at bedtime as needed for muscle spasms. (Patient not taking: Reported on 11/20/2016), Disp: 90 tablet, Rfl: 1 .  Biotin 1000 MCG CHEW, Chew by mouth., Disp: , Rfl:  .  naproxen (EC NAPROSYN) 500 MG EC tablet, Take 1 tablet (500 mg total) by mouth 2 (two) times daily with a meal., Disp: 60 tablet, Rfl: 5  Review of Systems  Constitutional: Negative.   HENT: Positive for sinus pressure.   Eyes: Positive for redness and itching.  Respiratory: Negative.   Cardiovascular: Negative.   Gastrointestinal: Negative.   Endocrine: Negative.   Genitourinary: Negative.   Musculoskeletal: Negative.   Skin: Negative.   Allergic/Immunologic: Negative.   Neurological: Negative.   Hematological: Negative.   Psychiatric/Behavioral: Negative.     Social History   Tobacco Use  . Smoking status: Former Smoker    Packs/day: 0.50    Years: 3.00    Pack years: 1.50  Last attempt to quit: 05/22/1993    Years since quitting: 24.7  . Smokeless tobacco: Never Used  Substance Use Topics  . Alcohol use: No    Comment: rare   Objective:   BP 122/88 (BP Location: Left Arm, Patient Position: Sitting)   Pulse 87   Temp 97.9 F (36.6 C) (Oral)   Ht 5' 4.5" (1.638 m)   Wt 179 lb 12.8 oz (81.6 kg)   SpO2 96%   BMI 30.39 kg/m  Vitals:   02/20/18 0944  BP: 122/88  Pulse: 87  Temp: 97.9 F (36.6 C)  TempSrc: Oral  SpO2: 96%  Weight: 179 lb 12.8 oz (81.6 kg)  Height: 5' 4.5" (1.638 m)     Physical Exam  Constitutional: She is oriented to person, place, and time. She appears well-developed and well-nourished. No distress.  Cardiovascular: Normal rate, regular rhythm and normal heart sounds. Exam reveals no gallop and no friction rub.    No murmur heard. Pulmonary/Chest: Effort normal and breath sounds normal. No respiratory distress. She has no wheezes. She has no rales.  Abdominal: Soft. Normal appearance and bowel sounds are normal. She exhibits no distension and no mass. There is no hepatosplenomegaly. There is no tenderness. There is no rebound, no guarding and no CVA tenderness.  Musculoskeletal:       Right wrist: Normal.       Right hand: Normal. She exhibits normal range of motion, no tenderness, normal two-point discrimination, normal capillary refill and no swelling. Normal sensation noted. Normal strength noted.  Neurological: She is alert and oriented to person, place, and time.  Skin: Skin is warm and dry. She is not diaphoretic.  Vitals reviewed.       Assessment & Plan:     1. Arthritis of right hand Will give naproxen as below. May alternate with tylenol. Discussed icing, bracing. Call if worsening.  - naproxen (EC NAPROSYN) 500 MG EC tablet; Take 1 tablet (500 mg total) by mouth 2 (two) times daily with a meal.  Dispense: 60 tablet; Refill: 5  2. Right upper quadrant abdominal pain Suspect gallbladder. Not frequent enough to require surgery at this time. Discussed avoiding trigger foods. Call if increasing in frequency.   3. Mild intermittent asthma without complication Stable. Diagnosis pulled for medication refill. Continue current medical treatment plan. - albuterol (PROVENTIL HFA;VENTOLIN HFA) 108 (90 Base) MCG/ACT inhaler; Inhale 1-2 puffs into the lungs every 6 (six) hours as needed for wheezing or shortness of breath.  Dispense: 1 Inhaler; Refill: 11  4. Influenza vaccination declined       Mar Daring, PA-C  [facility]

## 2018-02-20 NOTE — Telephone Encounter (Signed)
Pt came in today to see Sonia Baller.  She said she was supposed to get the flu vaccine while she was here but left with out getting it./  She said she would just get it next week at work  [employer]

## 2018-03-08 ENCOUNTER — Other Ambulatory Visit: Payer: Self-pay | Admitting: Physician Assistant

## 2018-03-08 DIAGNOSIS — M19041 Primary osteoarthritis, right hand: Secondary | ICD-10-CM

## 2018-03-08 MED ORDER — NAPROXEN 500 MG PO TBEC: 500.0000 mg | DELAYED_RELEASE_TABLET | Freq: Two times a day (BID) | ORAL | 5 refills | Status: DC

## 2018-03-08 NOTE — Telephone Encounter (Signed)
Pt needing refills: naproxen (EC NAPROSYN) 500 MG EC tablet Needing this called into new pharmacy due to insurance charging after 2 refills.    [facility] Address: [place], [place], [place] [address] Phone: [phone]   Thanks, [facility]

## 2018-03-11 ENCOUNTER — Telehealth: Payer: Self-pay | Admitting: Physician Assistant

## 2018-03-11 DIAGNOSIS — J3089 Other allergic rhinitis: Secondary | ICD-10-CM

## 2018-03-11 MED ORDER — AZELASTINE HCL 0.1 % NA SOLN: 2.0000 | Freq: Two times a day (BID) | NASAL | 1 refills | Status: DC

## 2018-03-11 NOTE — Telephone Encounter (Signed)
[facility] faxed refill request for the following medications:  azelastine (ASTELIN) 0.1 % nasal spray  Please advise.

## 2018-03-11 NOTE — Telephone Encounter (Signed)
refilled 

## 2018-03-20 DIAGNOSIS — Z23 Encounter for immunization: Secondary | ICD-10-CM | POA: Diagnosis not present

## 2018-04-22 ENCOUNTER — Encounter: Payer: Self-pay | Admitting: Physician Assistant

## 2018-04-22 ENCOUNTER — Ambulatory Visit: Payer: 59 | Admitting: Physician Assistant

## 2018-04-22 ENCOUNTER — Telehealth: Payer: Self-pay

## 2018-04-22 VITALS — BP 140/90 | HR 88 | Temp 98.4°F | Resp 16 | Wt 179.8 lb

## 2018-04-22 DIAGNOSIS — J0140 Acute pansinusitis, unspecified: Secondary | ICD-10-CM

## 2018-04-22 MED ORDER — AZITHROMYCIN 250 MG PO TABS: ORAL_TABLET | ORAL | 0 refills | Status: DC

## 2018-04-22 NOTE — Telephone Encounter (Signed)
Pt was seen today and prescribed Azithromycin 250mg .  Pt states it was supposed to go to [facility]. [facility].  Please resend.   Thanks,   -Mickel Baas

## 2018-04-22 NOTE — Patient Instructions (Signed)

## 2018-04-22 NOTE — Telephone Encounter (Signed)
Done

## 2018-04-22 NOTE — Addendum Note (Signed)
Addended by: Mar Daring on: 04/22/2018 12:16 PM   Modules accepted: Orders

## 2018-04-22 NOTE — Progress Notes (Signed)
Acute Office Visit  Subjective:    Patient ID: Rachel Ali, female    DOB: Dec 02, 1960, 57 y.o.   MRN: [id number]  Chief Complaint  Patient presents with  . URI    HPI Patient is in today for Upper Respiratory Infection: Patient complains of symptoms of a URI. Symptoms include sore throat and body ache. Onset of symptoms was 6 days ago, gradually improving since that time. She also c/o congestion, low grade fever, non productive cough, post nasal drip and sore throat for the past 1 day .  She is drinking plenty of fluids. Evaluation to date: none. Treatment to date: Mucinex.    Past Medical History:  Diagnosis Date  . Allergy    sinus problem    Past Surgical History:  Procedure Laterality Date  . DILATION AND CURETTAGE OF UTERUS    . ESSURE TUBAL LIGATION    . OTHER SURGICAL HISTORY     coil placement in tubes to prevent pregnancy-patient could not recall the name of the procedure  . TONSILLECTOMY      Family History  Problem Relation Age of Onset  . Hypertension Mother   . Uterine cancer Mother   . Heart disease Father   . Hypertension Father   . Cancer Father   . Hypertension Brother   . Congestive Heart Failure Maternal Grandmother   . Heart disease Maternal Grandfather   . Congestive Heart Failure Paternal Grandmother   . Stroke Paternal Grandfather   . Breast cancer Neg Hx     Social History   Socioeconomic History  . Marital status: Married    Spouse name: Magda Paganini  . Number of children: 1  . Years of education: Associates  . Highest education level: Not on file  Occupational History    Employer: [employer]  . Financial resource strain: Not on file  . Food insecurity:    Worry: Not on file    Inability: Not on file  . Transportation needs:    Medical: Not on file    Non-medical: Not on file  Tobacco Use  . Smoking status: Former Smoker    Packs/day: 0.50    Years: 3.00    Pack years: 1.50    Last attempt to quit: 05/22/1993    Years  since quitting: 24.9  . Smokeless tobacco: Never Used  Substance and Sexual Activity  . Alcohol use: No    Comment: rare  . Drug use: No  . Sexual activity: Yes    Birth control/protection: Post-menopausal  Lifestyle  . Physical activity:    Days per week: Not on file    Minutes per session: Not on file  . Stress: Not on file  Relationships  . Social connections:    Talks on phone: Not on file    Gets together: Not on file    Attends religious service: Not on file    Active member of club or organization: Not on file    Attends meetings of clubs or organizations: Not on file    Relationship status: Not on file  . Intimate partner violence:    Fear of current or ex partner: Not on file    Emotionally abused: Not on file    Physically abused: Not on file    Forced sexual activity: Not on file  Other Topics Concern  . Not on file  Social History Narrative  . Not on file    Outpatient Medications Prior to Visit  Medication  Sig Dispense Refill  . albuterol (PROVENTIL HFA;VENTOLIN HFA) 108 (90 Base) MCG/ACT inhaler Inhale 1-2 puffs into the lungs every 6 (six) hours as needed for wheezing or shortness of breath. 1 Inhaler 11  . Aloe Vera GEL Apply topically.    Marland Kitchen azelastine (ASTELIN) 0.1 % nasal spray Place 2 sprays into both nostrils 2 (two) times daily. 90 mL 1  . baclofen (LIORESAL) 10 MG tablet Take 1 tablet (10 mg total) by mouth at bedtime as needed for muscle spasms. 90 tablet 1  . Biotin 1000 MCG CHEW Chew by mouth.    . Cholecalciferol 1000 units capsule Take by mouth.    . diphenhydrAMINE (SOMINEX) 25 MG tablet Take 25 mg by mouth at bedtime as needed for sleep.    Marland Kitchen FLAXSEED, LINSEED, PO Take by mouth.    . Ibuprofen 200 MG CAPS Take by mouth.    . lansoprazole (PREVACID) 15 MG capsule Take 15 mg by mouth as needed.    . Multiple Vitamin tablet Take by mouth.    . naproxen (EC NAPROSYN) 500 MG EC tablet Take 1 tablet (500 mg total) by mouth 2 (two) times daily with a  meal. 60 tablet 5  . sertraline (ZOLOFT) 50 MG tablet TAKE 1 TABLET BY MOUTH  DAILY 90 tablet 1  . traZODone (DESYREL) 50 MG tablet TAKE 1 TABLET BY MOUTH AT  BEDTIME 90 tablet 1  . triamcinolone (NASACORT ALLERGY 24HR) 55 MCG/ACT AERO nasal inhaler Place 2 sprays into the nose daily.     No facility-administered medications prior to visit.     Allergies  Allergen Reactions  . Cinnamon     mild Trouble breathing.  Marland Kitchen Penicillins     Itching    Review of Systems  Constitutional: Positive for chills and malaise/fatigue.  HENT: Positive for congestion, sinus pain and sore throat.   Respiratory: Positive for cough.   Cardiovascular: Negative.   Skin: Negative.   Neurological: Positive for headaches.       Objective:    Physical Exam  Constitutional: She appears well-developed and well-nourished. No distress.  HENT:  Head: Normocephalic and atraumatic.  Right Ear: Hearing, tympanic membrane, external ear and ear canal normal.  Left Ear: Hearing, tympanic membrane, external ear and ear canal normal.  Nose: Right sinus exhibits maxillary sinus tenderness and frontal sinus tenderness. Left sinus exhibits maxillary sinus tenderness and frontal sinus tenderness.  Mouth/Throat: Uvula is midline, oropharynx is clear and moist and mucous membranes are normal. No oropharyngeal exudate.  Neck: Normal range of motion. Neck supple. No tracheal deviation present. No thyromegaly present.  Cardiovascular: Normal rate, regular rhythm and normal heart sounds. Exam reveals no gallop and no friction rub.  No murmur heard. Pulmonary/Chest: Effort normal and breath sounds normal. No stridor. No respiratory distress. She has no wheezes. She has no rales.  Lymphadenopathy:    She has no cervical adenopathy.  Skin: She is not diaphoretic.  Vitals reviewed.   BP 140/90 (BP Location: Left Arm, Patient Position: Sitting, Cuff Size: Normal)   Pulse 88   Temp 98.4 F (36.9 C) (Oral)   Resp 16   Wt  179 lb 12.8 oz (81.6 kg)   SpO2 96%   BMI 30.39 kg/m  Wt Readings from Last 3 Encounters:  04/22/18 179 lb 12.8 oz (81.6 kg)  02/20/18 179 lb 12.8 oz (81.6 kg)  11/30/17 176 lb 9.6 oz (80.1 kg)    There are no preventive care reminders to display for this  patient.  There are no preventive care reminders to display for this patient.   Lab Results  Component Value Date   TSH 1.290 11/30/2017   Lab Results  Component Value Date   WBC 5.5 11/30/2017   HGB 12.8 11/30/2017   HCT 37.9 11/30/2017   MCV 94 11/30/2017   PLT 268 11/30/2017   Lab Results  Component Value Date   NA 141 11/30/2017   K 3.9 11/30/2017   CO2 26 11/30/2017   GLUCOSE 92 11/30/2017   BUN 11 11/30/2017   CREATININE 0.77 11/30/2017   BILITOT <0.2 11/30/2017   ALKPHOS 128 (H) 11/30/2017   AST 31 11/30/2017   ALT 37 (H) 11/30/2017   PROT 6.9 11/30/2017   ALBUMIN 4.5 11/30/2017   CALCIUM 9.8 11/30/2017   Lab Results  Component Value Date   CHOL 219 (H) 11/30/2017   Lab Results  Component Value Date   HDL 51 11/30/2017   Lab Results  Component Value Date   LDLCALC 113 (H) 11/30/2017   Lab Results  Component Value Date   TRIG 276 (H) 11/30/2017   Lab Results  Component Value Date   CHOLHDL 4.3 11/30/2017   Lab Results  Component Value Date   HGBA1C 5.8 (H) 11/30/2017       Assessment & Plan:   Worsening symptoms that have not responded to OTC medications. Will give Zpak as below, patient allergic to PCN. Continue allergy medications. Stay well hydrated and get plenty of rest. Call if no symptom improvement or if symptoms worsen.  Problem List Items Addressed This Visit    None    Visit Diagnoses    Acute non-recurrent pansinusitis    -  Primary   Relevant Medications   azithromycin (ZITHROMAX) 250 MG tablet       Meds ordered this encounter  Medications  . azithromycin (ZITHROMAX) 250 MG tablet    Sig: Take 2 tablets PO on day one, and one tablet PO daily thereafter until  completed.    Dispense:  6 tablet    Refill:  0    Order Specific Question:   Supervising Provider    Answer:   Jerrol Banana [[id number]]   The entirety of the information documented in the History of Present Illness, Review of Systems and Physical Exam were personally obtained by me. Portions of this information were initially documented by Lendon Ka, CMA and reviewed by me for thoroughness and accuracy.  Mar Daring, PA-C

## 2018-06-17 ENCOUNTER — Other Ambulatory Visit: Payer: Self-pay | Admitting: Physician Assistant

## 2018-06-17 DIAGNOSIS — F419 Anxiety disorder, unspecified: Secondary | ICD-10-CM

## 2018-07-13 ENCOUNTER — Other Ambulatory Visit: Payer: Self-pay | Admitting: Physician Assistant

## 2018-07-13 DIAGNOSIS — F5101 Primary insomnia: Secondary | ICD-10-CM

## 2018-11-15 ENCOUNTER — Other Ambulatory Visit: Payer: Self-pay | Admitting: Physician Assistant

## 2018-11-15 DIAGNOSIS — F419 Anxiety disorder, unspecified: Secondary | ICD-10-CM

## 2018-12-06 ENCOUNTER — Encounter: Payer: Self-pay | Admitting: Physician Assistant

## 2019-02-17 ENCOUNTER — Encounter: Payer: Self-pay | Admitting: Physician Assistant

## 2019-02-25 ENCOUNTER — Encounter: Payer: Self-pay | Admitting: Physician Assistant

## 2019-03-11 NOTE — Progress Notes (Signed)
Patient: Rachel Ali, Female    DOB: 04-18-1961, 58 y.o.   MRN: [id number] Visit Date: 03/12/2019  Today's Provider: Mar Daring, PA-C   Chief Complaint  Patient presents with   Annual Exam   Subjective:     Annual physical exam Rachel Ali is a 58 y.o. female who presents today for health maintenance and complete physical. She feels well. She reports exercising. She reports she is sleeping well. ----------------------------------------------------------------- 11/23/2016 -Pap is normal, HPV negative. Will repeat in 5 years. 12/28/2017 -Normal mammogram. Repeat screening in one year.    Review of Systems  Constitutional: Negative.   HENT: Negative.   Eyes: Negative.   Respiratory: Negative.   Cardiovascular: Negative.   Gastrointestinal: Negative.   Endocrine: Negative.   Genitourinary: Negative.   Musculoskeletal: Negative.   Skin: Negative.   Allergic/Immunologic: Negative.   Neurological: Negative.   Hematological: Negative.   Psychiatric/Behavioral: Negative.       Social History      She  reports that she quit smoking about 25 years ago. She has a 1.50 pack-year smoking history. She has never used smokeless tobacco. She reports that she does not drink alcohol or use drugs.       Social History   Socioeconomic History   Marital status: Married    Spouse name: Magda Paganini   Number of children: 1   Years of education: Associates   Highest education level: Not on file  Occupational History    Employer: [employer]  Social Needs   Financial resource strain: Not on file   Food insecurity    Worry: Not on file    Inability: Not on file   Transportation needs    Medical: Not on file    Non-medical: Not on file  Tobacco Use   Smoking status: Former Smoker    Packs/day: 0.50    Years: 3.00    Pack years: 1.50    Quit date: 05/22/1993    Years since quitting: 25.8   Smokeless tobacco: Never Used  Substance and Sexual Activity   Alcohol use:  No    Comment: rare   Drug use: No   Sexual activity: Yes    Birth control/protection: Post-menopausal  Lifestyle   Physical activity    Days per week: Not on file    Minutes per session: Not on file   Stress: Not on file  Relationships   Social connections    Talks on phone: Not on file    Gets together: Not on file    Attends religious service: Not on file    Active member of club or organization: Not on file    Attends meetings of clubs or organizations: Not on file    Relationship status: Not on file  Other Topics Concern   Not on file  Social History Narrative   Not on file    Past Medical History:  Diagnosis Date   Allergy    sinus problem     Patient Active Problem List   Diagnosis Date Noted   Otalgia of both ears 12/28/2016   Low back pain 11/17/2015   Allergic rhinitis 07/01/2015   Anxiety 07/01/2015   BMI 28.0-28.9,adult 07/01/2015   Blood glucose elevated 07/01/2015   Climacteric 07/01/2015   Osteopenia 07/01/2015   Hemorrhage, postmenopausal 07/01/2015   Insomnia 12/10/2014    Past Surgical History:  Procedure Laterality Date   DILATION AND CURETTAGE OF UTERUS     ESSURE  TUBAL LIGATION     OTHER SURGICAL HISTORY     coil placement in tubes to prevent pregnancy-patient could not recall the name of the procedure   TONSILLECTOMY      Family History        Family Status  Relation Name Status   Mother  Alive   Father  Deceased at age 57   Brother  80   MGM  Deceased at age 74   MGF  Deceased   PGM  Deceased at age 19   PGF  Deceased   Neg Hx  (Not Specified)        Her family history includes Cancer in her father; Congestive Heart Failure in her maternal grandmother and paternal grandmother; Heart disease in her father and maternal grandfather; Hypertension in her brother, father, and mother; Stroke in her paternal grandfather; Uterine cancer in her mother. There is no history of Breast cancer.       Allergies  Allergen Reactions   Cinnamon     mild Trouble breathing.   Penicillins     Itching     Current Outpatient Medications:    Biotin 1000 MCG CHEW, Chew by mouth., Disp: , Rfl:    Cholecalciferol 1000 units capsule, Take by mouth., Disp: , Rfl:    famotidine (PEPCID) 20 MG tablet, Take 20 mg by mouth 2 (two) times daily., Disp: , Rfl:    FLAXSEED, LINSEED, PO, Take by mouth., Disp: , Rfl:    Ibuprofen 200 MG CAPS, Take by mouth., Disp: , Rfl:    Multiple Vitamin tablet, Take by mouth., Disp: , Rfl:    sertraline (ZOLOFT) 50 MG tablet, TAKE 1 TABLET BY MOUTH  DAILY, Disp: 90 tablet, Rfl: 1   traZODone (DESYREL) 50 MG tablet, TAKE 1 TABLET BY MOUTH AT  BEDTIME, Disp: 90 tablet, Rfl: 1   albuterol (PROVENTIL HFA;VENTOLIN HFA) 108 (90 Base) MCG/ACT inhaler, Inhale 1-2 puffs into the lungs every 6 (six) hours as needed for wheezing or shortness of breath. (Patient not taking: Reported on 03/12/2019), Disp: 1 Inhaler, Rfl: 11   azelastine (ASTELIN) 0.1 % nasal spray, Place 2 sprays into both nostrils 2 (two) times daily. (Patient not taking: Reported on 03/12/2019), Disp: 90 mL, Rfl: 1   baclofen (LIORESAL) 10 MG tablet, Take 1 tablet (10 mg total) by mouth at bedtime as needed for muscle spasms. (Patient not taking: Reported on 03/12/2019), Disp: 90 tablet, Rfl: 1   naproxen (EC NAPROSYN) 500 MG EC tablet, Take 1 tablet (500 mg total) by mouth 2 (two) times daily with a meal. (Patient not taking: Reported on 03/12/2019), Disp: 60 tablet, Rfl: 5   triamcinolone (NASACORT ALLERGY 24HR) 55 MCG/ACT AERO nasal inhaler, Place 2 sprays into the nose daily., Disp: , Rfl:    Patient Care Team: Mar Daring, PA-C as PCP - General (Family Medicine)    Objective:    Vitals: BP (!) 144/90 (BP Location: Left Arm, Patient Position: Sitting, Cuff Size: Large)    Pulse 87    Temp 97.6 F (36.4 C) (Other (Comment))    Resp 16    Ht 5\' 4"  (1.626 m)    Wt 181 lb 3.2 oz (82.2  kg)    BMI 31.10 kg/m    Vitals:   03/12/19 1333  BP: (!) 144/90  Pulse: 87  Resp: 16  Temp: 97.6 F (36.4 C)  TempSrc: Other (Comment)  Weight: 181 lb 3.2 oz (82.2 kg)  Height: 5\' 4"  (1.626 m)  Physical Exam Vitals signs reviewed.  Constitutional:      General: She is not in acute distress.    Appearance: Normal appearance. She is well-developed. She is obese. She is not ill-appearing or diaphoretic.  HENT:     Head: Normocephalic and atraumatic.     Right Ear: Tympanic membrane, ear canal and external ear normal.     Left Ear: Tympanic membrane, ear canal and external ear normal.     Nose: Nose normal.     Mouth/Throat:     Mouth: Mucous membranes are moist.     Pharynx: Oropharynx is clear. No oropharyngeal exudate.  Eyes:     General: No scleral icterus.       Right eye: No discharge.        Left eye: No discharge.     Extraocular Movements: Extraocular movements intact.     Conjunctiva/sclera: Conjunctivae normal.     Pupils: Pupils are equal, round, and reactive to light.  Neck:     Musculoskeletal: Normal range of motion and neck supple.     Thyroid: No thyromegaly.     Vascular: No carotid bruit or JVD.     Trachea: No tracheal deviation.  Cardiovascular:     Rate and Rhythm: Normal rate and regular rhythm.     Pulses: Normal pulses.     Heart sounds: Normal heart sounds. No murmur. No friction rub. No gallop.   Pulmonary:     Effort: Pulmonary effort is normal. No respiratory distress.     Breath sounds: Normal breath sounds. No wheezing or rales.  Chest:     Chest wall: No tenderness.     Breasts:        Right: Normal.        Left: Normal.  Abdominal:     General: Bowel sounds are normal. There is no distension.     Palpations: Abdomen is soft. There is no mass.     Tenderness: There is no abdominal tenderness. There is no guarding or rebound.  Musculoskeletal: Normal range of motion.        General: No tenderness.     Right lower leg: No edema.      Left lower leg: No edema.  Lymphadenopathy:     Cervical: No cervical adenopathy.     Upper Body:     Right upper body: No supraclavicular, axillary or pectoral adenopathy.     Left upper body: No supraclavicular, axillary or pectoral adenopathy.  Skin:    General: Skin is warm and dry.     Capillary Refill: Capillary refill takes less than 2 seconds.     Findings: No rash.  Neurological:     General: No focal deficit present.     Mental Status: She is alert and oriented to person, place, and time. Mental status is at baseline.  Psychiatric:        Mood and Affect: Mood normal.        Behavior: Behavior normal.        Thought Content: Thought content normal.        Judgment: Judgment normal.      Depression Screen PHQ 2/9 Scores 03/12/2019 11/30/2017 11/20/2016 11/17/2015  PHQ - 2 Score 0 1 0 0  PHQ- 9 Score - 3 4 -       Assessment & Plan:     Routine Health Maintenance and Physical Exam  Exercise Activities and Dietary recommendations Goals   None     Immunization History  Administered Date(s) Administered   Influenza,inj,Quad PF,6+ Mos 02/22/2013, 03/13/2015, 03/18/2016, 02/24/2017, 03/20/2018    Health Maintenance  Topic Date Due   TETANUS/TDAP  10/24/1980   COLONOSCOPY  10/25/2011   INFLUENZA VACCINE  12/21/2018   MAMMOGRAM  12/26/2018   PAP SMEAR-Modifier  11/21/2019   Hepatitis C Screening  Completed   HIV Screening  Completed     Discussed health benefits of physical activity, and encouraged her to engage in regular exercise appropriate for her age and condition.    1. Annual physical exam Normal physical exam today. Will check labs as below and f/u pending lab results. If labs are stable and WNL she will not need to have these rechecked for one year at her next annual physical exam. She is to call the office in the meantime if she has any acute issue, questions or concerns. - CBC with Differential/Platelet - Comprehensive metabolic  panel - Hemoglobin A1c - Lipid panel - TSH  2. Encounter for screening mammogram for breast cancer Breast exam today was normal. There is no family history of breast cancer. She does perform regular self breast exams. Mammogram was ordered as below. Information for [facility] Breast clinic was given to patient so she may schedule her mammogram at her convenience. - MM 3D SCREEN BREAST BILATERAL; Future  3. Screening for thyroid disorder Will check labs as below and f/u pending results. - TSH  4. Blood glucose elevated Diet controlled. Will check labs as below and f/u pending results. - Comprehensive metabolic panel - Hemoglobin A1c  5. Class 1 obesity due to excess calories with serious comorbidity and body mass index (BMI) of 31.0 to 31.9 in adult Counseled patient on healthy lifestyle modifications including dieting and exercise.  - Comprehensive metabolic panel - Hemoglobin A1c - Lipid panel  6. Primary insomnia Stable. Diagnosis pulled for medication refill. Continue current medical treatment plan. - traZODone (DESYREL) 50 MG tablet; Take 1 tablet (50 mg total) by mouth at bedtime.  Dispense: 90 tablet; Refill: 3  7. Anxiety Stable. Diagnosis pulled for medication refill. Continue current medical treatment plan. - sertraline (ZOLOFT) 50 MG tablet; Take 1 tablet (50 mg total) by mouth daily.  Dispense: 90 tablet; Refill: 3  8. Need for influenza vaccination Flu vaccine given today without complication. Patient sat upright for 15 minutes to check for adverse reaction before being released. - Flu Vaccine QUAD 36+ mos IM --------------------------------------------------------------------    Mar Daring, PA-C  [facility]

## 2019-03-12 ENCOUNTER — Encounter: Payer: Self-pay | Admitting: Physician Assistant

## 2019-03-12 ENCOUNTER — Ambulatory Visit (INDEPENDENT_AMBULATORY_CARE_PROVIDER_SITE_OTHER): Payer: Managed Care, Other (non HMO) | Admitting: Physician Assistant

## 2019-03-12 ENCOUNTER — Other Ambulatory Visit: Payer: Self-pay

## 2019-03-12 VITALS — BP 144/90 | HR 87 | Temp 97.6°F | Resp 16 | Ht 64.0 in | Wt 181.2 lb

## 2019-03-12 DIAGNOSIS — Z1329 Encounter for screening for other suspected endocrine disorder: Secondary | ICD-10-CM | POA: Diagnosis not present

## 2019-03-12 DIAGNOSIS — E669 Obesity, unspecified: Secondary | ICD-10-CM | POA: Insufficient documentation

## 2019-03-12 DIAGNOSIS — F419 Anxiety disorder, unspecified: Secondary | ICD-10-CM

## 2019-03-12 DIAGNOSIS — Z23 Encounter for immunization: Secondary | ICD-10-CM

## 2019-03-12 DIAGNOSIS — R739 Hyperglycemia, unspecified: Secondary | ICD-10-CM | POA: Diagnosis not present

## 2019-03-12 DIAGNOSIS — F5101 Primary insomnia: Secondary | ICD-10-CM

## 2019-03-12 DIAGNOSIS — Z0000 Encounter for general adult medical examination without abnormal findings: Secondary | ICD-10-CM

## 2019-03-12 DIAGNOSIS — Z6831 Body mass index (BMI) 31.0-31.9, adult: Secondary | ICD-10-CM

## 2019-03-12 DIAGNOSIS — Z1231 Encounter for screening mammogram for malignant neoplasm of breast: Secondary | ICD-10-CM

## 2019-03-12 DIAGNOSIS — E6609 Other obesity due to excess calories: Secondary | ICD-10-CM

## 2019-03-12 MED ORDER — SERTRALINE HCL 50 MG PO TABS: 50.0000 mg | ORAL_TABLET | Freq: Every day | ORAL | 3 refills | Status: DC

## 2019-03-12 MED ORDER — TRAZODONE HCL 50 MG PO TABS: 50.0000 mg | ORAL_TABLET | Freq: Every day | ORAL | 3 refills | Status: DC

## 2019-03-12 NOTE — Patient Instructions (Signed)

## 2019-03-15 LAB — COMPREHENSIVE METABOLIC PANEL
ALT: 30 IU/L (ref 0–32)
AST: 30 IU/L (ref 0–40)
Albumin/Globulin Ratio: 1.8 (ref 1.2–2.2)
Albumin: 4.4 g/dL (ref 3.8–4.9)
Alkaline Phosphatase: 137 IU/L — ABNORMAL HIGH (ref 39–117)
BUN/Creatinine Ratio: 16 (ref 9–23)
BUN: 13 mg/dL (ref 6–24)
Bilirubin Total: 0.3 mg/dL (ref 0.0–1.2)
CO2: 24 mmol/L (ref 20–29)
Calcium: 9.5 mg/dL (ref 8.7–10.2)
Chloride: 104 mmol/L (ref 96–106)
Creatinine, Ser: 0.83 mg/dL (ref 0.57–1.00)
GFR calc Af Amer: 91 mL/min/{1.73_m2} (ref >59)
GFR calc non Af Amer: 79 mL/min/{1.73_m2} (ref >59)
Globulin, Total: 2.5 g/dL (ref 1.5–4.5)
Glucose: 93 mg/dL (ref 65–99)
Potassium: 4.2 mmol/L (ref 3.5–5.2)
Sodium: 144 mmol/L (ref 134–144)
Total Protein: 6.9 g/dL (ref 6.0–8.5)

## 2019-03-15 LAB — CBC WITH DIFFERENTIAL/PLATELET
Basophils Absolute: 0 10*3/uL (ref 0.0–0.2)
Basos: 1 %
EOS (ABSOLUTE): 0.3 10*3/uL (ref 0.0–0.4)
Eos: 6 %
Hematocrit: 40.8 % (ref 34.0–46.6)
Hemoglobin: 13.4 g/dL (ref 11.1–15.9)
Immature Grans (Abs): 0 10*3/uL (ref 0.0–0.1)
Immature Granulocytes: 0 %
Lymphocytes Absolute: 2.1 10*3/uL (ref 0.7–3.1)
Lymphs: 42 %
MCH: 30.7 pg (ref 26.6–33.0)
MCHC: 32.8 g/dL (ref 31.5–35.7)
MCV: 93 fL (ref 79–97)
Monocytes Absolute: 0.4 10*3/uL (ref 0.1–0.9)
Monocytes: 8 %
Neutrophils Absolute: 2.1 10*3/uL (ref 1.4–7.0)
Neutrophils: 43 %
Platelets: 272 10*3/uL (ref 150–450)
RBC: 4.37 x10E6/uL (ref 3.77–5.28)
RDW: 12 % (ref 11.7–15.4)
WBC: 4.9 10*3/uL (ref 3.4–10.8)

## 2019-03-15 LAB — HEMOGLOBIN A1C
Est. average glucose Bld gHb Est-mCnc: 117 mg/dL
Hgb A1c MFr Bld: 5.7 % — ABNORMAL HIGH (ref 4.8–5.6)

## 2019-03-15 LAB — LIPID PANEL
Chol/HDL Ratio: 4.6 ratio — ABNORMAL HIGH (ref 0.0–4.4)
Cholesterol, Total: 220 mg/dL — ABNORMAL HIGH (ref 100–199)
HDL: 48 mg/dL (ref >39)
LDL Chol Calc (NIH): 123 mg/dL — ABNORMAL HIGH (ref 0–99)
Triglycerides: 282 mg/dL — ABNORMAL HIGH (ref 0–149)
VLDL Cholesterol Cal: 49 mg/dL — ABNORMAL HIGH (ref 5–40)

## 2019-03-15 LAB — TSH: TSH: 2.17 u[IU]/mL (ref 0.450–4.500)

## 2019-03-17 ENCOUNTER — Telehealth: Payer: Self-pay

## 2019-03-17 NOTE — Telephone Encounter (Signed)
-----   Message from Mar Daring, [place] sent at 03/17/2019  1:08 PM EDT ----- Blood count is normal. Kidney function is normal. Liver enzymes are essentially stable/slight improvement in ALT to normal range. Sodium, potassium and calcium are normal. A1c improved slightly to 5.7 from 5.8. Cholesterol is stable, but borderline high still. Current risk of having a cardiovascular event over the next 10 years is low at 3.7%. Recommend to continue working on healthy lifestyle modifications, including limiting red meats, fatty foods, and sugars in diet. Thyroid is normal.

## 2019-03-17 NOTE — Telephone Encounter (Signed)
Patient advised as below.  

## 2019-05-30 ENCOUNTER — Ambulatory Visit
Admission: RE | Admit: 2019-05-30 | Discharge: 2019-05-30 | Disposition: A | Discharge status: Home / Self Care | Payer: Managed Care, Other (non HMO) | Source: Ambulatory Visit | Attending: Physician Assistant | Admitting: Physician Assistant

## 2019-05-30 DIAGNOSIS — Z1231 Encounter for screening mammogram for malignant neoplasm of breast: Secondary | ICD-10-CM | POA: Insufficient documentation

## 2019-06-02 ENCOUNTER — Telehealth: Payer: Self-pay

## 2019-06-02 NOTE — Telephone Encounter (Signed)
Patient advised as directed below. 

## 2019-06-02 NOTE — Telephone Encounter (Signed)
-----   Message from Mar Daring, [place] sent at 06/02/2019  1:23 PM EST ----- Normal mammogram. Repeat screening in one year.

## 2019-07-22 ENCOUNTER — Telehealth: Payer: Self-pay

## 2019-07-22 DIAGNOSIS — Z1211 Encounter for screening for malignant neoplasm of colon: Secondary | ICD-10-CM

## 2019-07-22 NOTE — Telephone Encounter (Signed)
Copied from [place] [id number]. Topic: General - Inquiry >> Jul 22, 2019 12:45 PM Virl Axe D wrote: Reason for CRM: Pt would like to have a colonoscopy before the end of the year. She would like to know if that is something [facility] would schedule. Please advise.

## 2019-07-24 NOTE — Addendum Note (Signed)
Addended by: Mar Daring on: 07/24/2019 07:17 AM   Modules accepted: Orders

## 2019-07-24 NOTE — Telephone Encounter (Signed)
Referral placed.

## 2019-07-28 ENCOUNTER — Telehealth: Payer: Self-pay

## 2019-07-28 ENCOUNTER — Other Ambulatory Visit: Payer: Self-pay

## 2019-07-28 DIAGNOSIS — Z1211 Encounter for screening for malignant neoplasm of colon: Secondary | ICD-10-CM

## 2019-07-28 NOTE — Telephone Encounter (Signed)
Gastroenterology Pre-Procedure Review  Request Date: 08/29/19 Requesting Physician: Dr. Marius Ditch  PATIENT REVIEW QUESTIONS: The patient responded to the following health history questions as indicated:    1. Are you having any GI issues? no 2. Do you have a personal history of Polyps? no 3. Do you have a family history of Colon Cancer or Polyps? no 4. Diabetes Mellitus? no 5. Joint replacements in the past 12 months?no 6. Major health problems in the past 3 months?no 7. Any artificial heart valves, MVP, or defibrillator?no    MEDICATIONS & ALLERGIES:    Patient reports the following regarding taking any anticoagulation/antiplatelet therapy:   Plavix, Coumadin, Eliquis, Xarelto, Lovenox, Pradaxa, Brilinta, or Effient? no Aspirin? no  Patient confirms/reports the following medications:  Current Outpatient Medications  Medication Sig Dispense Refill   albuterol (PROVENTIL HFA;VENTOLIN HFA) 108 (90 Base) MCG/ACT inhaler Inhale 1-2 puffs into the lungs every 6 (six) hours as needed for wheezing or shortness of breath. (Patient not taking: Reported on 03/12/2019) 1 Inhaler 11   Biotin 1000 MCG CHEW Chew by mouth.     Cholecalciferol 1000 units capsule Take by mouth.     famotidine (PEPCID) 20 MG tablet Take 20 mg by mouth 2 (two) times daily.     FLAXSEED, LINSEED, PO Take by mouth.     Ibuprofen 200 MG CAPS Take by mouth.     Multiple Vitamin tablet Take by mouth.     sertraline (ZOLOFT) 50 MG tablet Take 1 tablet (50 mg total) by mouth daily. 90 tablet 3   traZODone (DESYREL) 50 MG tablet Take 1 tablet (50 mg total) by mouth at bedtime. 90 tablet 3   triamcinolone (NASACORT ALLERGY 24HR) 55 MCG/ACT AERO nasal inhaler Place 2 sprays into the nose daily.     No current facility-administered medications for this visit.    Patient confirms/reports the following allergies:  Allergies  Allergen Reactions   Cinnamon     mild Trouble breathing.   Penicillins     Itching    No orders of  the defined types were placed in this encounter.   AUTHORIZATION INFORMATION Primary Insurance: 1D#: Group #:  Secondary Insurance: 1D#: Group #:  SCHEDULE INFORMATION: Date:  Time: Location:

## 2019-08-27 ENCOUNTER — Other Ambulatory Visit
Admission: RE | Admit: 2019-08-27 | Discharge: 2019-08-27 | Disposition: A | Discharge status: Home / Self Care | Payer: Managed Care, Other (non HMO) | Source: Ambulatory Visit | Attending: Gastroenterology | Admitting: Gastroenterology

## 2019-08-27 ENCOUNTER — Other Ambulatory Visit: Payer: Self-pay

## 2019-08-27 DIAGNOSIS — Z20822 Contact with and (suspected) exposure to covid-19: Secondary | ICD-10-CM | POA: Diagnosis not present

## 2019-08-27 DIAGNOSIS — Z01812 Encounter for preprocedural laboratory examination: Secondary | ICD-10-CM | POA: Insufficient documentation

## 2019-08-27 LAB — SARS CORONAVIRUS 2 (TAT 6-24 HRS): SARS Coronavirus 2: NEGATIVE

## 2019-08-28 ENCOUNTER — Encounter: Payer: Self-pay | Admitting: Gastroenterology

## 2019-08-29 ENCOUNTER — Ambulatory Visit: Payer: Managed Care, Other (non HMO) | Admitting: Anesthesiology

## 2019-08-29 ENCOUNTER — Ambulatory Visit
Admission: RE | Admit: 2019-08-29 | Discharge: 2019-08-29 | Disposition: A | Discharge status: Home / Self Care | Payer: Managed Care, Other (non HMO) | Attending: Gastroenterology | Admitting: Gastroenterology

## 2019-08-29 ENCOUNTER — Encounter
Admission: RE | Disposition: A | Discharge status: Home / Self Care | Payer: Self-pay | Source: Home / Self Care | Attending: Gastroenterology

## 2019-08-29 ENCOUNTER — Encounter: Payer: Self-pay | Admitting: *Deleted

## 2019-08-29 DIAGNOSIS — Z87891 Personal history of nicotine dependence: Secondary | ICD-10-CM | POA: Insufficient documentation

## 2019-08-29 DIAGNOSIS — Z8249 Family history of ischemic heart disease and other diseases of the circulatory system: Secondary | ICD-10-CM | POA: Insufficient documentation

## 2019-08-29 DIAGNOSIS — Z8049 Family history of malignant neoplasm of other genital organs: Secondary | ICD-10-CM | POA: Insufficient documentation

## 2019-08-29 DIAGNOSIS — D122 Benign neoplasm of ascending colon: Secondary | ICD-10-CM | POA: Insufficient documentation

## 2019-08-29 DIAGNOSIS — Z791 Long term (current) use of non-steroidal anti-inflammatories (NSAID): Secondary | ICD-10-CM | POA: Insufficient documentation

## 2019-08-29 DIAGNOSIS — Z823 Family history of stroke: Secondary | ICD-10-CM | POA: Diagnosis not present

## 2019-08-29 DIAGNOSIS — K635 Polyp of colon: Secondary | ICD-10-CM | POA: Diagnosis not present

## 2019-08-29 DIAGNOSIS — Z79899 Other long term (current) drug therapy: Secondary | ICD-10-CM | POA: Insufficient documentation

## 2019-08-29 DIAGNOSIS — Z91018 Allergy to other foods: Secondary | ICD-10-CM | POA: Diagnosis not present

## 2019-08-29 DIAGNOSIS — K644 Residual hemorrhoidal skin tags: Secondary | ICD-10-CM | POA: Diagnosis not present

## 2019-08-29 DIAGNOSIS — Z809 Family history of malignant neoplasm, unspecified: Secondary | ICD-10-CM | POA: Insufficient documentation

## 2019-08-29 DIAGNOSIS — F419 Anxiety disorder, unspecified: Secondary | ICD-10-CM | POA: Diagnosis not present

## 2019-08-29 DIAGNOSIS — Z1211 Encounter for screening for malignant neoplasm of colon: Secondary | ICD-10-CM | POA: Diagnosis present

## 2019-08-29 DIAGNOSIS — Z880 Allergy status to penicillin: Secondary | ICD-10-CM | POA: Diagnosis not present

## 2019-08-29 DIAGNOSIS — K5730 Diverticulosis of large intestine without perforation or abscess without bleeding: Secondary | ICD-10-CM | POA: Diagnosis not present

## 2019-08-29 HISTORY — PX: COLONOSCOPY WITH PROPOFOL: SHX5780

## 2019-08-29 SURGERY — COLONOSCOPY WITH PROPOFOL
Anesthesia: General

## 2019-08-29 MED ORDER — PROPOFOL 500 MG/50ML IV EMUL
INTRAVENOUS | Status: AC
  Filled 2019-08-29: qty 50

## 2019-08-29 MED ORDER — PROPOFOL 10 MG/ML IV BOLUS
INTRAVENOUS | Status: DC | PRN
  Administered 2019-08-29: 100 mg via INTRAVENOUS

## 2019-08-29 MED ORDER — ONDANSETRON HCL 4 MG/2ML IJ SOLN
INTRAMUSCULAR | Status: AC
  Filled 2019-08-29: qty 2

## 2019-08-29 MED ORDER — PROPOFOL 10 MG/ML IV BOLUS
INTRAVENOUS | Status: AC
  Filled 2019-08-29: qty 20

## 2019-08-29 MED ORDER — PROPOFOL 500 MG/50ML IV EMUL
INTRAVENOUS | Status: DC | PRN
  Administered 2019-08-29: 175 ug/kg/min via INTRAVENOUS

## 2019-08-29 MED ORDER — LIDOCAINE HCL (CARDIAC) PF 100 MG/5ML IV SOSY
PREFILLED_SYRINGE | INTRAVENOUS | Status: DC | PRN
  Administered 2019-08-29: 100 mg via INTRATRACHEAL

## 2019-08-29 MED ORDER — ONDANSETRON HCL 4 MG/2ML IJ SOLN
INTRAMUSCULAR | Status: DC | PRN
  Administered 2019-08-29: 4 mg via INTRAVENOUS

## 2019-08-29 MED ORDER — LIDOCAINE HCL (PF) 2 % IJ SOLN
INTRAMUSCULAR | Status: AC
  Filled 2019-08-29: qty 10

## 2019-08-29 MED ORDER — SODIUM CHLORIDE 0.9 % IV SOLN
INTRAVENOUS | Status: DC
  Administered 2019-08-29: 1000 mL via INTRAVENOUS

## 2019-08-29 NOTE — Anesthesia Procedure Notes (Signed)
Date/Time: 08/29/2019 10:43 AM Performed by: Babs Sciara, CRNA Oxygen Delivery Method: Supernova nasal CPAP

## 2019-08-29 NOTE — Anesthesia Preprocedure Evaluation (Addendum)
Anesthesia Evaluation  Patient identified by MRN, date of birth, ID band Patient awake    Reviewed: Allergy & Precautions, H&P , NPO status , Patient's Chart, lab work & pertinent test results  Airway Mallampati: II  TM Distance: >3 FB Neck ROM: full    Dental  (+) Teeth Intact   Pulmonary neg COPD, former smoker,    breath sounds clear to auscultation       Cardiovascular (-) angina(-) Past MI negative cardio ROS  (-) dysrhythmias  Rhythm:regular Rate:Normal     Neuro/Psych Anxiety negative neurological ROS     GI/Hepatic negative GI ROS, Neg liver ROS,   Endo/Other  negative endocrine ROS  Renal/GU negative Renal ROS  negative genitourinary   Musculoskeletal   Abdominal   Peds  Hematology negative hematology ROS (+)   Anesthesia Other Findings Past Medical History: No date: Allergy     Comment:  sinus problem  Past Surgical History: No date: DILATION AND CURETTAGE OF UTERUS No date: ESSURE TUBAL LIGATION No date: OTHER SURGICAL HISTORY     Comment:  coil placement in tubes to prevent pregnancy-patient               could not recall the name of the procedure No date: TONSILLECTOMY No date: TUBAL LIGATION  BMI    Body Mass Index: 31.24 kg/m      Reproductive/Obstetrics negative OB ROS                            Anesthesia Physical Anesthesia Plan  ASA: I  Anesthesia Plan: General   Post-op Pain Management:    Induction:   PONV Risk Score and Plan: Propofol infusion and TIVA  Airway Management Planned:   Additional Equipment:   Intra-op Plan:   Post-operative Plan:   Informed Consent: I have reviewed the patients History and Physical, chart, labs and discussed the procedure including the risks, benefits and alternatives for the proposed anesthesia with the patient or authorized representative who has indicated his/her understanding and acceptance.     Dental  Advisory Given  Plan Discussed with: Anesthesiologist  Anesthesia Plan Comments:         Anesthesia Quick Evaluation

## 2019-08-29 NOTE — Transfer of Care (Signed)
Immediate Anesthesia Transfer of Care Note  Patient: Rachel Ali  Procedure(s) Performed: COLONOSCOPY WITH PROPOFOL (N/A )  Patient Location: PACU and Endoscopy Unit  Anesthesia Type:General  Level of Consciousness: awake and drowsy  Airway & Oxygen Therapy: Patient Spontanous Breathing  Post-op Assessment: Report given to RN and Post -op Vital signs reviewed and stable  Post vital signs: Reviewed and stable  Last Vitals:  Vitals Value Taken Time  BP 109/56 08/29/19 1109  Temp    Pulse 77 08/29/19 1110  Resp 13 08/29/19 1110  SpO2 99 % 08/29/19 1110  Vitals shown include unvalidated device data.  Last Pain:  Vitals:   08/29/19 0917  TempSrc: Temporal  PainSc: 0-No pain         Complications: No apparent anesthesia complications

## 2019-08-29 NOTE — Anesthesia Postprocedure Evaluation (Signed)
Anesthesia Post Note  Patient: Rachel Ali  Procedure(s) Performed: COLONOSCOPY WITH PROPOFOL (N/A )  Patient location during evaluation: PACU Anesthesia Type: General Level of consciousness: awake and alert Pain management: pain level controlled Vital Signs Assessment: post-procedure vital signs reviewed and stable Respiratory status: spontaneous breathing, nonlabored ventilation and respiratory function stable Cardiovascular status: blood pressure returned to baseline and stable Postop Assessment: no apparent nausea or vomiting Anesthetic complications: no     Last Vitals:  Vitals:   08/29/19 1120 08/29/19 1124  BP: 120/78 130/89  Pulse: 74 73  Resp: 13 14  Temp:    SpO2: 98% 98%    Last Pain:  Vitals:   08/29/19 0917  TempSrc: Temporal  PainSc: 0-No pain                 Tera Mater

## 2019-08-29 NOTE — Op Note (Signed)
[facility] Gastroenterology Patient Name: Rachel Ali Procedure Date: 08/29/2019 10:38 AM MRN: [id number] Account #: [id number] Date of Birth: 1961-03-24 Admit Type: Outpatient Age: 59 Room: [facility] ENDO ROOM 2 Gender: Female Note Status: Finalized Procedure:             Colonoscopy Indications:           Screening for colorectal malignant neoplasm, Last                         colonoscopy: October 2013 Providers:             Lin Landsman MD, MD Referring MD:          Mar Daring (Referring MD) Medicines:             Monitored Anesthesia Care Complications:         No immediate complications. Estimated blood loss: None. Procedure:             Pre-Anesthesia Assessment:                        - Prior to the procedure, a History and Physical was                         performed, and patient medications and allergies were                         reviewed. The patient is competent. The risks and                         benefits of the procedure and the sedation options and                         risks were discussed with the patient. All questions                         were answered and informed consent was obtained.                         Patient identification and proposed procedure were                         verified by the physician, the nurse, the                         anesthesiologist, the anesthetist and the technician                         in the pre-procedure area in the procedure room in the                         endoscopy suite. Mental Status Examination: alert and                         oriented. Airway Examination: normal oropharyngeal                         airway and neck mobility. Respiratory Examination:  clear to auscultation. CV Examination: normal.                         Prophylactic Antibiotics: The patient does not require                         prophylactic antibiotics. Prior Anticoagulants: The                        patient has taken no previous anticoagulant or                         antiplatelet agents. ASA Grade Assessment: I - A                         normal, healthy patient. After reviewing the risks and                         benefits, the patient was deemed in satisfactory                         condition to undergo the procedure. The anesthesia                         plan was to use monitored anesthesia care (MAC).                         Immediately prior to administration of medications,                         the patient was re-assessed for adequacy to receive                         sedatives. The heart rate, respiratory rate, oxygen                         saturations, blood pressure, adequacy of pulmonary                         ventilation, and response to care were monitored                         throughout the procedure. The physical status of the                         patient was re-assessed after the procedure.                        After obtaining informed consent, the colonoscope was                         passed under direct vision. Throughout the procedure,                         the patient's blood pressure, pulse, and oxygen                         saturations were monitored continuously. The  Colonoscope was introduced through the anus and                         advanced to the the cecum, identified by appendiceal                         orifice and ileocecal valve. The colonoscopy was                         performed without difficulty. The patient tolerated                         the procedure well. The quality of the bowel                         preparation was evaluated using the BBPS [facility] Bowel                         Preparation Scale) with scores of: Right Colon = 3,                         Transverse Colon = 3 and Left Colon = 3 (entire mucosa                         seen well with no residual staining,  small fragments                         of stool or opaque liquid). The total BBPS score                         equals 9. Findings:      The perianal and digital rectal examinations were normal. Pertinent       negatives include normal sphincter tone and no palpable rectal lesions.      A 7 mm polyp was found in the ascending colon. The polyp was sessile.       The polyp was removed with a cold snare. Resection and retrieval were       complete.      A few diverticula were found in the sigmoid colon.      Non-bleeding external hemorrhoids were found during retroflexion. The       hemorrhoids were large.      The exam was otherwise without abnormality. Impression:            - One 7 mm polyp in the ascending colon, removed with                         a cold snare. Resected and retrieved.                        - Diverticulosis in the sigmoid colon.                        - Non-bleeding external hemorrhoids.                        - The examination was otherwise normal. Recommendation:        - Discharge patient to home (with escort).                        -  Resume previous diet today.                        - Continue present medications.                        - Await pathology results.                        - Repeat colonoscopy in 5-7 years for surveillance. Procedure Code(s):     --- Professional ---                        [phone], Colonoscopy, flexible; with removal of                         tumor(s), polyp(s), or other lesion(s) by snare                         technique Diagnosis Code(s):     --- Professional ---                        Z12.11, Encounter for screening for malignant neoplasm                         of colon                        K63.5, Polyp of colon                        K64.4, Residual hemorrhoidal skin tags                        K57.30, Diverticulosis of large intestine without                         perforation or abscess without bleeding CPT copyright  2019 American Medical Association. All rights reserved. The codes documented in this report are preliminary and upon coder review may  be revised to meet current compliance requirements. Dr. Ulyess Mort Lin Landsman MD, MD 08/29/2019 11:05:28 AM This report has been signed electronically. Number of Addenda: 0 Note Initiated On: 08/29/2019 10:38 AM Scope Withdrawal Time: 0 hours 10 minutes 6 seconds  Total Procedure Duration: 0 hours 13 minutes 47 seconds  Estimated Blood Loss:  Estimated blood loss: none.      [facility]

## 2019-08-29 NOTE — H&P (Signed)
Cephas Darby, MD [address]  [place]  [place], [place] [address]  Main: [phone]  Fax: [phone] Pager: [phone]  Primary Care Physician:  Mar Daring, PA-C Primary Gastroenterologist:  Dr. Cephas Darby  Pre-Procedure History & Physical: HPI:  Rachel Ali is a 59 y.o. female is here for an colonoscopy.   Past Medical History:  Diagnosis Date  . Allergy    sinus problem    Past Surgical History:  Procedure Laterality Date  . DILATION AND CURETTAGE OF UTERUS    . ESSURE TUBAL LIGATION    . OTHER SURGICAL HISTORY     coil placement in tubes to prevent pregnancy-patient could not recall the name of the procedure  . TONSILLECTOMY    . TUBAL LIGATION      Prior to Admission medications   Medication Sig Start Date End Date Taking? Authorizing Provider  Biotin 1000 MCG CHEW Chew by mouth.   Yes [provider]  Cholecalciferol 1000 units capsule Take by mouth.   Yes [provider]  famotidine (PEPCID) 20 MG tablet Take 20 mg by mouth 2 (two) times daily.   Yes [provider]  FLAXSEED, LINSEED, PO Take by mouth.   Yes [provider]  Ibuprofen 200 MG CAPS Take by mouth.   Yes [provider]  Multiple Vitamin tablet Take by mouth.   Yes [provider]  sertraline (ZOLOFT) 50 MG tablet Take 1 tablet (50 mg total) by mouth daily. 03/12/19  Yes Mar Daring, PA-C  traZODone (DESYREL) 50 MG tablet Take 1 tablet (50 mg total) by mouth at bedtime. 03/12/19  Yes Fenton Malling M, PA-C  triamcinolone (NASACORT ALLERGY 24HR) 55 MCG/ACT AERO nasal inhaler Place 2 sprays into the nose daily.   Yes [provider]  albuterol (PROVENTIL HFA;VENTOLIN HFA) 108 (90 Base) MCG/ACT inhaler Inhale 1-2 puffs into the lungs every 6 (six) hours as needed for wheezing or shortness of breath. Patient not taking: Reported on 03/12/2019 02/20/18   Mar Daring, PA-C    Allergies as of  07/29/2019 - Review Complete 03/12/2019  Allergen Reaction Noted  . Cinnamon  07/01/2015  . Penicillins  07/01/2015    Family History  Problem Relation Age of Onset  . Hypertension Mother   . Uterine cancer Mother   . Heart disease Father   . Hypertension Father   . Cancer Father   . Hypertension Brother   . Congestive Heart Failure Maternal Grandmother   . Heart disease Maternal Grandfather   . Congestive Heart Failure Paternal Grandmother   . Stroke Paternal Grandfather   . Breast cancer Neg Hx     Social History   Socioeconomic History  . Marital status: Married    Spouse name: Magda Paganini  . Number of children: 1  . Years of education: Associates  . Highest education level: Not on file  Occupational History    Employer: [employer]  Tobacco Use  . Smoking status: Former Smoker    Packs/day: 0.50    Years: 3.00    Pack years: 1.50    Quit date: 05/22/1993    Years since quitting: 26.2  . Smokeless tobacco: Never Used  Substance and Sexual Activity  . Alcohol use: No    Comment: rare  . Drug use: No  . Sexual activity: Yes    Birth control/protection: Post-menopausal  Other Topics Concern  . Not on file  Social History Narrative  . Not on file  Social Determinants of Health   Financial Resource Strain:   . Difficulty of Paying Living Expenses:   Food Insecurity:   . Worried About Charity fundraiser in the Last Year:   . Arboriculturist in the Last Year:   Transportation Needs:   . Film/video editor (Medical):   Marland Kitchen Lack of Transportation (Non-Medical):   Physical Activity:   . Days of Exercise per Week:   . Minutes of Exercise per Session:   Stress:   . Feeling of Stress :   Social Connections:   . Frequency of Communication with Friends and Family:   . Frequency of Social Gatherings with Friends and Family:   . Attends Religious Services:   . Active Member of Clubs or Organizations:   . Attends Archivist Meetings:   Marland Kitchen Marital Status:     Intimate Partner Violence:   . Fear of Current or Ex-Partner:   . Emotionally Abused:   Marland Kitchen Physically Abused:   . Sexually Abused:     Review of Systems: See HPI, otherwise negative ROS  Physical Exam: BP (!) 160/104   Pulse 82   Temp (!) 96.7 F (35.9 C) (Temporal)   Resp 20   Ht 5\' 4"  (1.626 m)   Wt 82.6 kg   SpO2 99%   BMI 31.24 kg/m  General:   Alert,  pleasant and cooperative in NAD Head:  Normocephalic and atraumatic. Neck:  Supple; no masses or thyromegaly. Lungs:  Clear throughout to auscultation.    Heart:  Regular rate and rhythm. Abdomen:  Soft, nontender and nondistended. Normal bowel sounds, without guarding, and without rebound.   Neurologic:  Alert and  oriented x4;  grossly normal neurologically.  Impression/Plan: Rachel Ali is here for an colonoscopy to be performed for colon cancer screening  Risks, benefits, limitations, and alternatives regarding  colonoscopy have been reviewed with the patient.  Questions have been answered.  All parties agreeable.   Sherri Sear, MD  08/29/2019, 9:43 AM

## 2019-09-01 ENCOUNTER — Encounter: Payer: Self-pay | Admitting: Gastroenterology

## 2019-09-01 LAB — SURGICAL PATHOLOGY

## 2019-12-23 ENCOUNTER — Other Ambulatory Visit: Payer: Self-pay

## 2019-12-23 ENCOUNTER — Encounter: Payer: Self-pay | Admitting: Dermatology

## 2019-12-23 ENCOUNTER — Ambulatory Visit (INDEPENDENT_AMBULATORY_CARE_PROVIDER_SITE_OTHER): Payer: Managed Care, Other (non HMO) | Admitting: Dermatology

## 2019-12-23 DIAGNOSIS — L738 Other specified follicular disorders: Secondary | ICD-10-CM

## 2019-12-23 DIAGNOSIS — L814 Other melanin hyperpigmentation: Secondary | ICD-10-CM

## 2019-12-23 DIAGNOSIS — L578 Other skin changes due to chronic exposure to nonionizing radiation: Secondary | ICD-10-CM

## 2019-12-23 DIAGNOSIS — D229 Melanocytic nevi, unspecified: Secondary | ICD-10-CM | POA: Diagnosis not present

## 2019-12-23 DIAGNOSIS — L820 Inflamed seborrheic keratosis: Secondary | ICD-10-CM

## 2019-12-23 DIAGNOSIS — L821 Other seborrheic keratosis: Secondary | ICD-10-CM

## 2019-12-23 DIAGNOSIS — D1800 Hemangioma unspecified site: Secondary | ICD-10-CM

## 2019-12-23 DIAGNOSIS — Z1283 Encounter for screening for malignant neoplasm of skin: Secondary | ICD-10-CM

## 2019-12-23 NOTE — Patient Instructions (Addendum)

## 2019-12-23 NOTE — Progress Notes (Addendum)
   Follow-Up Visit   Subjective  Rachel Ali is a 59 y.o. female who presents for the following: TBSE.  Patient presents today for annual TBSE, has a few areas of concern today on back, L hand, L knee which itch and small spots under left eye and under nose. Patient has no h/o skin cancer  The following portions of the chart were reviewed this encounter and updated as appropriate:      Review of Systems:  No other skin or systemic complaints except as noted in HPI or Assessment and Plan.  Objective  Well appearing patient in no apparent distress; mood and affect are within normal limits.  A full examination was performed including scalp, head, eyes, ears, nose, lips, neck, chest, axillae, abdomen, back, buttocks, bilateral upper extremities, bilateral lower extremities, hands, feet, fingers, toes, fingernails, and toenails. All findings within normal limits unless otherwise noted below.  Objective  Left Dorsal Hand x 2 (2), Left Inferior Knee, Left Upper Back: Tan waxy stuck-on macule with erythema  Objective  Right Upper Inner Arm: 5 mm waxy tan macule  Objective  Left Upper  Lip: Small yellow papules with a central dell.    Assessment & Plan  Inflamed seborrheic keratosis (4) Left Inferior Knee; Left Upper Back; Left Dorsal Hand x 2 (2)  Cryotherapy today Prior to procedure, discussed risks of blister formation, small wound, skin dyspigmentation, or rare scar following cryotherapy.    Destruction of lesion - Left Dorsal Hand x 2, Left Inferior Knee, Left Upper Back  Destruction method: cryotherapy   Informed consent: discussed and consent obtained   Lesion destroyed using liquid nitrogen: Yes   Region frozen until ice ball extended beyond lesion: Yes   Outcome: patient tolerated procedure well with no complications   Post-procedure details: wound care instructions given    Seborrheic keratosis Right Upper Inner Arm  Seborrheic keratosis vs benign  nevus  Benign-appearing.  Observation.  Call clinic for new or changing moles.  Recommend daily use of broad spectrum spf 30+ sunscreen to sun-exposed areas.    Sebaceous hyperplasia Left Upper  Lip  Benign, observe.     Lentigines - Scattered tan macules - Discussed due to sun exposure - Benign, observe - Call for any changes  Seborrheic Keratoses - Stuck-on, waxy, tan-brown macules arms, legs, small waxy flesh papules L infraocular - Discussed benign etiology and prognosis. - Observe - Call for any changes  Melanocytic Nevi - Tan-brown and/or pink-flesh-colored symmetric macules and papules - Benign appearing on exam today - Observation - Call clinic for new or changing moles - Recommend daily use of broad spectrum spf 30+ sunscreen to sun-exposed areas.   Hemangiomas - Red papules - Discussed benign nature - Observe - Call for any changes  Actinic Damage - diffuse scaly erythematous macules with underlying dyspigmentation - Recommend daily broad spectrum sunscreen SPF 30+ to sun-exposed areas, reapply every 2 hours as needed.  - Call for new or changing lesions.  Skin cancer screening performed today.   Return in about 1 year (around 12/22/2020) for TBSE.  I, Donzetta Kohut, CMA, am acting as scribe for Brendolyn Patty, MD .  Documentation: I have reviewed the above documentation for accuracy and completeness, and I agree with the above.  Brendolyn Patty MD

## 2020-03-09 ENCOUNTER — Other Ambulatory Visit: Payer: Self-pay | Admitting: Physician Assistant

## 2020-03-09 DIAGNOSIS — F419 Anxiety disorder, unspecified: Secondary | ICD-10-CM

## 2020-03-18 ENCOUNTER — Encounter: Payer: Managed Care, Other (non HMO) | Admitting: Physician Assistant

## 2020-03-24 NOTE — Progress Notes (Signed)
Complete physical exam   Patient: Rachel Ali   DOB: 24-Apr-1961   59 y.o. Female  MRN: [id number] Visit Date: 03/26/2020  Today's healthcare provider: Mar Daring, PA-C   Chief Complaint  Patient presents with  . Annual Exam   Subjective    Rachel Ali is a 59 y.o. female who presents today for a complete physical exam.  She reports consuming a general diet. Home exercise routine includes walks 15-20 minutes everyday. She generally feels well. She reports sleeping well. She does not have additional problems to discuss today.  HPI  11/23/2016 -Pap is normal, HPV negative. Will repeat in 3-5 years. 06/02/2019-Normal mammogram. Repeat screening in one year. 08/29/19-Repeat colonoscopy in 5-7 years for surveillance.  Past Medical History:  Diagnosis Date  . Allergy    sinus problem   Past Surgical History:  Procedure Laterality Date  . COLONOSCOPY WITH PROPOFOL N/A 08/29/2019   Procedure: COLONOSCOPY WITH PROPOFOL;  Surgeon: Lin Landsman, MD;  Location: [facility] ENDOSCOPY;  Service: Gastroenterology;  Laterality: N/A;  . DILATION AND CURETTAGE OF UTERUS    . ESSURE TUBAL LIGATION    . OTHER SURGICAL HISTORY     coil placement in tubes to prevent pregnancy-patient could not recall the name of the procedure  . TONSILLECTOMY    . TUBAL LIGATION     Social History   Socioeconomic History  . Marital status: Married    Spouse name: Magda Paganini  . Number of children: 1  . Years of education: Associates  . Highest education level: Not on file  Occupational History    Employer: [employer]  Tobacco Use  . Smoking status: Former Smoker    Packs/day: 0.50    Years: 3.00    Pack years: 1.50    Quit date: 05/22/1993    Years since quitting: 26.8  . Smokeless tobacco: Never Used  Vaping Use  . Vaping Use: Never used  Substance and Sexual Activity  . Alcohol use: No    Comment: rare  . Drug use: No  . Sexual activity: Yes    Birth control/protection: Post-menopausal    Other Topics Concern  . Not on file  Social History Narrative  . Not on file   Social Determinants of Health   Financial Resource Strain:   . Difficulty of Paying Living Expenses: Not on file  Food Insecurity:   . Worried About Charity fundraiser in the Last Year: Not on file  . Ran Out of Food in the Last Year: Not on file  Transportation Needs:   . Lack of Transportation (Medical): Not on file  . Lack of Transportation (Non-Medical): Not on file  Physical Activity:   . Days of Exercise per Week: Not on file  . Minutes of Exercise per Session: Not on file  Stress:   . Feeling of Stress : Not on file  Social Connections:   . Frequency of Communication with Friends and Family: Not on file  . Frequency of Social Gatherings with Friends and Family: Not on file  . Attends Religious Services: Not on file  . Active Member of Clubs or Organizations: Not on file  . Attends Archivist Meetings: Not on file  . Marital Status: Not on file  Intimate Partner Violence:   . Fear of Current or Ex-Partner: Not on file  . Emotionally Abused: Not on file  . Physically Abused: Not on file  . Sexually Abused: Not on file  Family Status  Relation Name Status  . Mother  Alive  . Father  Deceased at age 95  . Brother  Alive  . MGM  Deceased at age 80  . MGF  Deceased  . PGM  Deceased at age 27  . PGF  Deceased  . Neg Hx  (Not Specified)   Family History  Problem Relation Age of Onset  . Hypertension Mother   . Uterine cancer Mother   . Heart disease Father   . Hypertension Father   . Cancer Father   . Hypertension Brother   . Congestive Heart Failure Maternal Grandmother   . Heart disease Maternal Grandfather   . Congestive Heart Failure Paternal Grandmother   . Stroke Paternal Grandfather   . Breast cancer Neg Hx    Allergies  Allergen Reactions  . Cinnamon     mild Trouble breathing.  . Latex   . Penicillins     Itching    Patient Care Team: Mar Daring, PA-C as PCP - General (Family Medicine)   Medications: Outpatient Medications Prior to Visit  Medication Sig  . azelastine (ASTELIN) 0.1 % nasal spray Place into the nose.  . baclofen (LIORESAL) 10 MG tablet Take by mouth.  . Biotin 1000 MCG CHEW Chew by mouth.  . Cholecalciferol 1000 units capsule Take by mouth.  . famotidine (PEPCID) 20 MG tablet Take 20 mg by mouth 2 (two) times daily.  Marland Kitchen FLAXSEED, LINSEED, PO Take by mouth.  . Ibuprofen 200 MG CAPS Take by mouth.  . Multiple Vitamin tablet Take by mouth.  . sertraline (ZOLOFT) 50 MG tablet TAKE 1 TABLET BY MOUTH  DAILY  . traZODone (DESYREL) 50 MG tablet Take 1 tablet (50 mg total) by mouth at bedtime.  . [DISCONTINUED] albuterol (PROVENTIL HFA;VENTOLIN HFA) 108 (90 Base) MCG/ACT inhaler Inhale 1-2 puffs into the lungs every 6 (six) hours as needed for wheezing or shortness of breath.  . [DISCONTINUED] triamcinolone (NASACORT ALLERGY 24HR) 55 MCG/ACT AERO nasal inhaler Place 2 sprays into the nose daily.   No facility-administered medications prior to visit.    Review of Systems  Constitutional: Negative.   HENT: Positive for congestion ("chronic"), postnasal drip ("chronic") and sinus pressure ("chronic").   Eyes: Negative.   Respiratory: Negative.   Cardiovascular: Negative.   Gastrointestinal: Negative.   Endocrine: Negative.   Genitourinary: Negative.   Musculoskeletal: Negative.   Skin: Negative.   Allergic/Immunologic: Negative.   Neurological: Negative.   Hematological: Negative.   Psychiatric/Behavioral: Negative.     Last CBC Lab Results  Component Value Date   WBC 4.9 03/14/2019   HGB 13.4 03/14/2019   HCT 40.8 03/14/2019   MCV 93 03/14/2019   MCH 30.7 03/14/2019   RDW 12.0 03/14/2019   PLT 272 68/34/1961   Last metabolic panel Lab Results  Component Value Date   GLUCOSE 93 03/14/2019   NA 144 03/14/2019   K 4.2 03/14/2019   CL 104 03/14/2019   CO2 24 03/14/2019   BUN 13 03/14/2019    CREATININE 0.83 03/14/2019   GFRNONAA 79 03/14/2019   GFRAA 91 03/14/2019   CALCIUM 9.5 03/14/2019   PROT 6.9 03/14/2019   ALBUMIN 4.4 03/14/2019   LABGLOB 2.5 03/14/2019   AGRATIO 1.8 03/14/2019   BILITOT 0.3 03/14/2019   ALKPHOS 137 (H) 03/14/2019   AST 30 03/14/2019   ALT 30 03/14/2019      Objective    BP (!) 145/86 (BP Location: Left Arm, Patient Position: Sitting,  Cuff Size: Large)   Pulse 76   Temp 98.5 F (36.9 C) (Oral)   Resp 16   Ht 5\' 4"  (1.626 m)   Wt 183 lb (83 kg)   BMI 31.41 kg/m  BP Readings from Last 3 Encounters:  03/26/20 (!) 145/86  08/29/19 130/89  03/12/19 (!) 144/90   Wt Readings from Last 3 Encounters:  03/26/20 183 lb (83 kg)  08/29/19 182 lb (82.6 kg)  03/12/19 181 lb 3.2 oz (82.2 kg)      Physical Exam Vitals reviewed.  Constitutional:      General: She is not in acute distress.    Appearance: Normal appearance. She is well-developed and well-groomed. She is obese. She is not ill-appearing or diaphoretic.  HENT:     Head: Normocephalic and atraumatic.     Right Ear: Hearing, tympanic membrane, ear canal and external ear normal.     Left Ear: Hearing, tympanic membrane, ear canal and external ear normal.     Nose: Nose normal.     Mouth/Throat:     Mouth: Mucous membranes are moist.     Pharynx: Oropharynx is clear. Uvula midline. No oropharyngeal exudate or posterior oropharyngeal erythema.  Eyes:     General: No scleral icterus.       Right eye: No discharge.        Left eye: No discharge.     Extraocular Movements: Extraocular movements intact.     Conjunctiva/sclera: Conjunctivae normal.     Pupils: Pupils are equal, round, and reactive to light.  Neck:     Thyroid: No thyromegaly.     Vascular: No carotid bruit or JVD.     Trachea: No tracheal deviation.  Cardiovascular:     Rate and Rhythm: Normal rate and regular rhythm.     Pulses: Normal pulses.     Heart sounds: Normal heart sounds. No murmur heard.  No friction  rub. No gallop.   Pulmonary:     Effort: Pulmonary effort is normal. No respiratory distress.     Breath sounds: Normal breath sounds. No wheezing or rales.  Chest:     Chest wall: No tenderness.     Breasts: Breasts are symmetrical.        Right: No inverted nipple, mass, nipple discharge, skin change or tenderness.        Left: No inverted nipple, mass, nipple discharge, skin change or tenderness.  Abdominal:     General: Abdomen is flat. Bowel sounds are normal. There is no distension.     Palpations: Abdomen is soft. There is no mass.     Tenderness: There is no abdominal tenderness. There is no guarding or rebound.     Hernia: There is no hernia in the left inguinal area.  Genitourinary:    General: Normal vulva.     Exam position: Supine.     Labia:        Right: No rash, tenderness, lesion or injury.        Left: No rash, tenderness, lesion or injury.      Vagina: Normal. No signs of injury. No vaginal discharge, erythema, tenderness or bleeding.     Cervix: No cervical motion tenderness, discharge or friability.     Adnexa:        Right: No mass, tenderness or fullness.         Left: No mass, tenderness or fullness.       Rectum: Normal.  Musculoskeletal:  General: No tenderness. Normal range of motion.     Cervical back: Normal range of motion and neck supple.     Right lower leg: No edema.     Left lower leg: No edema.  Lymphadenopathy:     Cervical: No cervical adenopathy.  Skin:    General: Skin is warm and dry.     Capillary Refill: Capillary refill takes less than 2 seconds.     Findings: No rash.  Neurological:     General: No focal deficit present.     Mental Status: She is alert and oriented to person, place, and time. Mental status is at baseline.     Cranial Nerves: No cranial nerve deficit.     Coordination: Coordination normal.     Deep Tendon Reflexes: Reflexes are normal and symmetric.  Psychiatric:        Mood and Affect: Mood normal.         Behavior: Behavior normal. Behavior is cooperative.        Thought Content: Thought content normal.        Judgment: Judgment normal.       Last depression screening scores PHQ 2/9 Scores 03/12/2019 11/30/2017 11/20/2016  PHQ - 2 Score 0 1 0  PHQ- 9 Score - 3 4   Last fall risk screening Fall Risk  03/12/2019  Falls in the past year? 0  Number falls in past yr: 0  Injury with Fall? 0  Follow up Falls evaluation completed   Last Audit-C alcohol use screening Alcohol Use Disorder Test (AUDIT) 03/12/2019  1. How often do you have a drink containing alcohol? 0  2. How many drinks containing alcohol do you have on a typical day when you are drinking? 0  3. How often do you have six or more drinks on one occasion? 0  AUDIT-C Score 0  Alcohol Brief Interventions/Follow-up AUDIT Score <7 follow-up not indicated   A score of 3 or more in women, and 4 or more in men indicates increased risk for alcohol abuse, EXCEPT if all of the points are from question 1   No results found for any visits on 03/26/20.  Assessment & Plan    Routine Health Maintenance and Physical Exam  Exercise Activities and Dietary recommendations Goals   None     Immunization History  Administered Date(s) Administered  . Influenza,inj,Quad PF,6+ Mos 02/22/2013, 03/13/2015, 03/18/2016, 02/24/2017, 03/20/2018, 03/12/2019  . Moderna SARS-COVID-2 Vaccination 08/18/2019, 09/15/2019    Health Maintenance  Topic Date Due  . TETANUS/TDAP  Never done  . INFLUENZA VACCINE  12/21/2019  . MAMMOGRAM  05/29/2020  . PAP SMEAR-Modifier  11/20/2021  . COLONOSCOPY  08/28/2024  . COVID-19 Vaccine  Completed  . Hepatitis C Screening  Completed  . HIV Screening  Completed    Discussed health benefits of physical activity, and encouraged her to engage in regular exercise appropriate for her age and condition.  1. Annual physical exam Normal physical exam today. Will check labs as below and f/u pending lab results. If  labs are stable and WNL she will not need to have these rechecked for one year at her next annual physical exam. She is to call the office in the meantime if she has any acute issue, questions or concerns. - CBC with Differential/Platelet - Comprehensive metabolic panel - Hemoglobin A1c - Lipid panel - TSH - Vitamin D (25 hydroxy) - B12 and Folate Panel  2. Encounter for breast cancer screening using non-mammogram modality Breast exam  today was normal. There is no family history of breast cancer. She does perform regular self breast exams. Mammogram was ordered as below. Information for [facility] clinic was given to patient so she may schedule her mammogram at her convenience. - MM 3D SCREEN BREAST BILATERAL; Future  3. Cervical cancer screening Pap collected today. Will send as below and f/u pending results. - IGP,Aptima HPV,CtNg Age Gdln  4. Blood glucose elevated Diet controlled. Will check labs as below and f/u pending results. - Comprehensive metabolic panel - Hemoglobin A1c  5. Class 1 obesity due to excess calories with serious comorbidity and body mass index (BMI) of 31.0 to 31.9 in adult Counseled patient on healthy lifestyle modifications including dieting and exercise.  - CBC with Differential/Platelet - Comprehensive metabolic panel - Hemoglobin A1c - Lipid panel - TSH - Vitamin D (25 hydroxy) - B12 and Folate Panel  6. Need for Tdap vaccination Tdap Vaccine given to patient without complications. Patient sat for 15 minutes after administration and was tolerated well without adverse effects. - Tdap vaccine greater than or equal to 7yo IM  7. Need for influenza vaccination Flu vaccine given today without complication. Patient sat upright for 15 minutes to check for adverse reaction before being released. - Flu Vaccine QUAD 36+ mos IM  8. Anxiety Stable. Diagnosis pulled for medication refill. Continue current medical treatment plan. - sertraline (ZOLOFT) 50  MG tablet; Take 1 tablet (50 mg total) by mouth daily.  Dispense: 90 tablet; Refill: 3   No follow-ups on file.     Reynolds Bowl, PA-C, have reviewed all documentation for this visit. The documentation on 03/26/20 for the exam, diagnosis, procedures, and orders are all accurate and complete.   Rubye Beach  [facility] [phone] (phone) [phone] (fax)  [place]

## 2020-03-26 ENCOUNTER — Encounter: Payer: Self-pay | Admitting: Physician Assistant

## 2020-03-26 ENCOUNTER — Other Ambulatory Visit: Payer: Self-pay

## 2020-03-26 ENCOUNTER — Ambulatory Visit (INDEPENDENT_AMBULATORY_CARE_PROVIDER_SITE_OTHER): Payer: Managed Care, Other (non HMO) | Admitting: Physician Assistant

## 2020-03-26 VITALS — BP 145/86 | HR 76 | Temp 98.5°F | Resp 16 | Ht 64.0 in | Wt 183.0 lb

## 2020-03-26 DIAGNOSIS — F419 Anxiety disorder, unspecified: Secondary | ICD-10-CM

## 2020-03-26 DIAGNOSIS — Z124 Encounter for screening for malignant neoplasm of cervix: Secondary | ICD-10-CM | POA: Diagnosis not present

## 2020-03-26 DIAGNOSIS — Z1239 Encounter for other screening for malignant neoplasm of breast: Secondary | ICD-10-CM

## 2020-03-26 DIAGNOSIS — Z23 Encounter for immunization: Secondary | ICD-10-CM

## 2020-03-26 DIAGNOSIS — Z0000 Encounter for general adult medical examination without abnormal findings: Secondary | ICD-10-CM | POA: Diagnosis not present

## 2020-03-26 DIAGNOSIS — R739 Hyperglycemia, unspecified: Secondary | ICD-10-CM | POA: Diagnosis not present

## 2020-03-26 DIAGNOSIS — E6609 Other obesity due to excess calories: Secondary | ICD-10-CM

## 2020-03-26 DIAGNOSIS — Z6831 Body mass index (BMI) 31.0-31.9, adult: Secondary | ICD-10-CM

## 2020-03-26 MED ORDER — SERTRALINE HCL 50 MG PO TABS: 50.0000 mg | ORAL_TABLET | Freq: Every day | ORAL | 3 refills | Status: DC

## 2020-03-26 NOTE — Patient Instructions (Signed)
[facility] [address] [place],  [place]  [address] Main: [phone]   Preventive Care 40-58 Years Old, Female Preventive care refers to visits with your health care provider and lifestyle choices that can promote health and wellness. This includes:  A yearly physical exam. This may also be called an annual well check.  Regular dental visits and eye exams.  Immunizations.  Screening for certain conditions.  Healthy lifestyle choices, such as eating a healthy diet, getting regular exercise, not using drugs or products that contain nicotine and tobacco, and limiting alcohol use. What can I expect for my preventive care visit? Physical exam Your health care provider will check your:  Height and weight. This may be used to calculate body mass index (BMI), which tells if you are at a healthy weight.  Heart rate and blood pressure.  Skin for abnormal spots. Counseling Your health care provider may ask you questions about your:  Alcohol, tobacco, and drug use.  Emotional well-being.  Home and relationship well-being.  Sexual activity.  Eating habits.  Work and work environment.  Method of birth control.  Menstrual cycle.  Pregnancy history. What immunizations do I need?  Influenza (flu) vaccine  This is recommended every year. Tetanus, diphtheria, and pertussis (Tdap) vaccine  You may need a Td booster every 10 years. Varicella (chickenpox) vaccine  You may need this if you have not been vaccinated. Zoster (shingles) vaccine  You may need this after age 60. Measles, mumps, and rubella (MMR) vaccine  You may need at least one dose of MMR if you were born in 1957 or later. You may also need a second dose. Pneumococcal conjugate (PCV13) vaccine  You may need this if you have certain conditions and were not previously vaccinated. Pneumococcal polysaccharide (PPSV23) vaccine  You may need one or two doses if you smoke  cigarettes or if you have certain conditions. Meningococcal conjugate (MenACWY) vaccine  You may need this if you have certain conditions. Hepatitis A vaccine  You may need this if you have certain conditions or if you travel or work in places where you may be exposed to hepatitis A. Hepatitis B vaccine  You may need this if you have certain conditions or if you travel or work in places where you may be exposed to hepatitis B. Haemophilus influenzae type b (Hib) vaccine  You may need this if you have certain conditions. Human papillomavirus (HPV) vaccine  If recommended by your health care provider, you may need three doses over 6 months. You may receive vaccines as individual doses or as more than one vaccine together in one shot (combination vaccines). Talk with your health care provider about the risks and benefits of combination vaccines. What tests do I need? Blood tests  Lipid and cholesterol levels. These may be checked every 5 years, or more frequently if you are over 50 years old.  Hepatitis C test.  Hepatitis B test. Screening  Lung cancer screening. You may have this screening every year starting at age 55 if you have a 30-pack-year history of smoking and currently smoke or have quit within the past 15 years.  Colorectal cancer screening. All adults should have this screening starting at age 50 and continuing until age 75. Your health care provider may recommend screening at age 45 if you are at increased risk. You will have tests every 1-10 years, depending on your results and the type of screening test.  Diabetes screening. This is   done by checking your blood sugar (glucose) after you have not eaten for a while (fasting). You may have this done every 1-3 years.  Mammogram. This may be done every 1-2 years. Talk with your health care provider about when you should start having regular mammograms. This may depend on whether you have a family history of breast  cancer.  BRCA-related cancer screening. This may be done if you have a family history of breast, ovarian, tubal, or peritoneal cancers.  Pelvic exam and Pap test. This may be done every 3 years starting at age 36. Starting at age 66, this may be done every 5 years if you have a Pap test in combination with an HPV test. Other tests  Sexually transmitted disease (STD) testing.  Bone density scan. This is done to screen for osteoporosis. You may have this scan if you are at high risk for osteoporosis. Follow these instructions at home: Eating and drinking  Eat a diet that includes fresh fruits and vegetables, whole grains, lean protein, and low-fat dairy.  Take vitamin and mineral supplements as recommended by your health care provider.  Do not drink alcohol if: ? Your health care provider tells you not to drink. ? You are pregnant, may be pregnant, or are planning to become pregnant.  If you drink alcohol: ? Limit how much you have to 0-1 drink a day. ? Be aware of how much alcohol is in your drink. In the [place] one drink equals one 12 oz bottle of beer (355 mL), one 5 oz glass of wine (148 mL), or one 1 oz glass of hard liquor (44 mL). Lifestyle  Take daily care of your teeth and gums.  Stay active. Exercise for at least 30 minutes on 5 or more days each week.  Do not use any products that contain nicotine or tobacco, such as cigarettes, e-cigarettes, and chewing tobacco. If you need help quitting, ask your health care provider.  If you are sexually active, practice safe sex. Use a condom or other form of birth control (contraception) in order to prevent pregnancy and STIs (sexually transmitted infections).  If told by your health care provider, take low-dose aspirin daily starting at age 55. What's next?  Visit your health care provider once a year for a well check visit.  Ask your health care provider how often you should have your eyes and teeth checked.  Stay up to date  on all vaccines. This information is not intended to replace advice given to you by your health care provider. Make sure you discuss any questions you have with your health care provider. Document Revised: 01/17/2018 Document Reviewed: 01/17/2018 Elsevier Patient Education  2020 [employer].

## 2020-03-27 LAB — VITAMIN D 25 HYDROXY (VIT D DEFICIENCY, FRACTURES): Vit D, 25-Hydroxy: 42.9 ng/mL (ref 30.0–100.0)

## 2020-03-27 LAB — CBC WITH DIFFERENTIAL/PLATELET
Basophils Absolute: 0 10*3/uL (ref 0.0–0.2)
Basos: 1 %
EOS (ABSOLUTE): 0.2 10*3/uL (ref 0.0–0.4)
Eos: 4 %
Hematocrit: 38.6 % (ref 34.0–46.6)
Hemoglobin: 13.3 g/dL (ref 11.1–15.9)
Immature Grans (Abs): 0 10*3/uL (ref 0.0–0.1)
Immature Granulocytes: 0 %
Lymphocytes Absolute: 1.6 10*3/uL (ref 0.7–3.1)
Lymphs: 36 %
MCH: 31.9 pg (ref 26.6–33.0)
MCHC: 34.5 g/dL (ref 31.5–35.7)
MCV: 93 fL (ref 79–97)
Monocytes Absolute: 0.4 10*3/uL (ref 0.1–0.9)
Monocytes: 9 %
Neutrophils Absolute: 2.3 10*3/uL (ref 1.4–7.0)
Neutrophils: 50 %
Platelets: 248 10*3/uL (ref 150–450)
RBC: 4.17 x10E6/uL (ref 3.77–5.28)
RDW: 12 % (ref 11.7–15.4)
WBC: 4.6 10*3/uL (ref 3.4–10.8)

## 2020-03-27 LAB — COMPREHENSIVE METABOLIC PANEL
ALT: 35 IU/L — ABNORMAL HIGH (ref 0–32)
AST: 32 IU/L (ref 0–40)
Albumin/Globulin Ratio: 2.1 (ref 1.2–2.2)
Albumin: 4.6 g/dL (ref 3.8–4.9)
Alkaline Phosphatase: 144 IU/L — ABNORMAL HIGH (ref 44–121)
BUN/Creatinine Ratio: 15 (ref 9–23)
BUN: 12 mg/dL (ref 6–24)
Bilirubin Total: 0.3 mg/dL (ref 0.0–1.2)
CO2: 24 mmol/L (ref 20–29)
Calcium: 9.2 mg/dL (ref 8.7–10.2)
Chloride: 100 mmol/L (ref 96–106)
Creatinine, Ser: 0.81 mg/dL (ref 0.57–1.00)
GFR calc Af Amer: 93 mL/min/{1.73_m2} (ref >59)
GFR calc non Af Amer: 80 mL/min/{1.73_m2} (ref >59)
Globulin, Total: 2.2 g/dL (ref 1.5–4.5)
Glucose: 102 mg/dL — ABNORMAL HIGH (ref 65–99)
Potassium: 4.2 mmol/L (ref 3.5–5.2)
Sodium: 138 mmol/L (ref 134–144)
Total Protein: 6.8 g/dL (ref 6.0–8.5)

## 2020-03-27 LAB — LIPID PANEL
Chol/HDL Ratio: 4.6 ratio — ABNORMAL HIGH (ref 0.0–4.4)
Cholesterol, Total: 227 mg/dL — ABNORMAL HIGH (ref 100–199)
HDL: 49 mg/dL (ref >39)
LDL Chol Calc (NIH): 142 mg/dL — ABNORMAL HIGH (ref 0–99)
Triglycerides: 201 mg/dL — ABNORMAL HIGH (ref 0–149)
VLDL Cholesterol Cal: 36 mg/dL (ref 5–40)

## 2020-03-27 LAB — HEMOGLOBIN A1C
Est. average glucose Bld gHb Est-mCnc: 126 mg/dL
Hgb A1c MFr Bld: 6 % — ABNORMAL HIGH (ref 4.8–5.6)

## 2020-03-27 LAB — B12 AND FOLATE PANEL
Folate: >20 ng/mL (ref >3.0)
Vitamin B-12: 633 pg/mL (ref 232–1245)

## 2020-03-27 LAB — TSH: TSH: 1.49 u[IU]/mL (ref 0.450–4.500)

## 2020-03-31 ENCOUNTER — Other Ambulatory Visit: Payer: Self-pay | Admitting: Physician Assistant

## 2020-03-31 DIAGNOSIS — E7800 Pure hypercholesterolemia, unspecified: Secondary | ICD-10-CM

## 2020-03-31 LAB — IGP, APTIMA HPV, RFX 16/18,45
HPV Aptima: NEGATIVE
PAP Smear Comment: 0

## 2020-03-31 LAB — IGP,APTIMA HPV,CTNG AGE GDLN

## 2020-03-31 MED ORDER — ATORVASTATIN CALCIUM 10 MG PO TABS: 10.0000 mg | ORAL_TABLET | Freq: Every day | ORAL | 1 refills | Status: DC

## 2020-04-19 ENCOUNTER — Other Ambulatory Visit: Payer: Self-pay | Admitting: Physician Assistant

## 2020-04-19 DIAGNOSIS — F5101 Primary insomnia: Secondary | ICD-10-CM

## 2020-04-19 NOTE — Telephone Encounter (Signed)
Requested medication (s) are due for refill today: Yes  Requested medication (s) are on the active medication list: Yes  Last refill:  03/12/19  Future visit scheduled: Yes  Notes to clinic:  Unable to refill per protocol, expired Rx     Requested Prescriptions  Pending Prescriptions Disp Refills   traZODone (DESYREL) 50 MG tablet [Pharmacy Med Name: traZODone HCl 50 MG Oral Tablet] 90 tablet 3    Sig: TAKE 1 TABLET BY MOUTH AT  BEDTIME      Psychiatry: Antidepressants - Serotonin Modulator Passed - 04/19/2020  4:49 PM      Passed - Valid encounter within last 6 months    Recent Outpatient Visits           3 weeks ago Annual physical exam   [facility] [place], Clearnce Sorrel, [place]   1 year ago Annual physical exam   [place], Clearnce Sorrel, [place]   1 year ago Acute non-recurrent pansinusitis   [facility] [place], Clearnce Sorrel, [place]   2 years ago Arthritis of right hand   [place], Clearnce Sorrel, [place]   2 years ago Annual physical exam   [facility] [place], Clearnce Sorrel, [place]       Future Appointments             In 11 months [place], Clearnce Sorrel, PA-C [employer], [place]

## 2020-05-24 ENCOUNTER — Other Ambulatory Visit: Payer: Self-pay | Admitting: Physician Assistant

## 2020-05-24 DIAGNOSIS — E7800 Pure hypercholesterolemia, unspecified: Secondary | ICD-10-CM

## 2020-05-31 ENCOUNTER — Ambulatory Visit
Admission: RE | Admit: 2020-05-31 | Discharge: 2020-05-31 | Disposition: A | Discharge status: Home / Self Care | Payer: Managed Care, Other (non HMO) | Source: Ambulatory Visit | Attending: Physician Assistant | Admitting: Physician Assistant

## 2020-05-31 ENCOUNTER — Other Ambulatory Visit: Payer: Self-pay

## 2020-05-31 DIAGNOSIS — Z1239 Encounter for other screening for malignant neoplasm of breast: Secondary | ICD-10-CM

## 2020-05-31 DIAGNOSIS — Z1231 Encounter for screening mammogram for malignant neoplasm of breast: Secondary | ICD-10-CM | POA: Insufficient documentation

## 2020-06-02 ENCOUNTER — Telehealth: Payer: Self-pay

## 2020-06-02 NOTE — Telephone Encounter (Signed)
Pt advised.   Thanks,   -Shahrukh Pasch  

## 2020-06-02 NOTE — Telephone Encounter (Signed)
-----   Message from Mar Daring, [place] sent at 05/31/2020  2:19 PM EST ----- Normal mammogram. Repeat screening in one year.

## 2020-08-12 ENCOUNTER — Other Ambulatory Visit: Payer: Self-pay

## 2020-08-12 ENCOUNTER — Ambulatory Visit (INDEPENDENT_AMBULATORY_CARE_PROVIDER_SITE_OTHER): Payer: BC Managed Care – PPO | Admitting: Physician Assistant

## 2020-08-12 VITALS — BP 153/109 | HR 84 | Temp 98.4°F | Ht 64.0 in | Wt 185.6 lb

## 2020-08-12 DIAGNOSIS — F419 Anxiety disorder, unspecified: Secondary | ICD-10-CM | POA: Diagnosis not present

## 2020-08-12 DIAGNOSIS — R739 Hyperglycemia, unspecified: Secondary | ICD-10-CM | POA: Diagnosis not present

## 2020-08-12 DIAGNOSIS — E7800 Pure hypercholesterolemia, unspecified: Secondary | ICD-10-CM | POA: Diagnosis not present

## 2020-08-12 DIAGNOSIS — Z0000 Encounter for general adult medical examination without abnormal findings: Secondary | ICD-10-CM

## 2020-08-12 DIAGNOSIS — F5101 Primary insomnia: Secondary | ICD-10-CM

## 2020-08-12 MED ORDER — ATORVASTATIN CALCIUM 10 MG PO TABS: 10.0000 mg | ORAL_TABLET | Freq: Every day | ORAL | 3 refills | Status: DC

## 2020-08-12 MED ORDER — SERTRALINE HCL 50 MG PO TABS: 50.0000 mg | ORAL_TABLET | Freq: Every day | ORAL | 3 refills | Status: DC

## 2020-08-12 MED ORDER — TRAZODONE HCL 50 MG PO TABS: 50.0000 mg | ORAL_TABLET | Freq: Every day | ORAL | 3 refills | Status: DC

## 2020-08-12 NOTE — Progress Notes (Signed)
Complete physical exam   Patient: Rachel Ali   DOB: 07/29/1960   59 y.o. Female  MRN: [id number] Visit Date: 08/12/2020  Today's healthcare provider: Mar Daring, PA-C   No chief complaint on file.  Subjective    Rachel Ali is a 60 y.o. female who presents today for a complete physical exam.  She reports consuming a general-trying to eat more vegetables diet. Home exercise routine includes walking 1 hrs per day. She generally feels well. She reports sleeping well. She does not have additional problems to discuss today.  HPI     Past Medical History:  Diagnosis Date  . Allergy    sinus problem   Past Surgical History:  Procedure Laterality Date  . COLONOSCOPY WITH PROPOFOL N/A 08/29/2019   Procedure: COLONOSCOPY WITH PROPOFOL;  Surgeon: Lin Landsman, MD;  Location: [facility] ENDOSCOPY;  Service: Gastroenterology;  Laterality: N/A;  . DILATION AND CURETTAGE OF UTERUS    . ESSURE TUBAL LIGATION    . OTHER SURGICAL HISTORY     coil placement in tubes to prevent pregnancy-patient could not recall the name of the procedure  . TONSILLECTOMY    . TUBAL LIGATION     Social History   Socioeconomic History  . Marital status: Married    Spouse name: Magda Paganini  . Number of children: 1  . Years of education: Associates  . Highest education level: Not on file  Occupational History    Employer: [employer]  Tobacco Use  . Smoking status: Former Smoker    Packs/day: 0.50    Years: 3.00    Pack years: 1.50    Quit date: 05/22/1993    Years since quitting: 27.2  . Smokeless tobacco: Never Used  Vaping Use  . Vaping Use: Never used  Substance and Sexual Activity  . Alcohol use: No    Comment: rare  . Drug use: No  . Sexual activity: Yes    Birth control/protection: Post-menopausal  Other Topics Concern  . Not on file  Social History Narrative  . Not on file   Social Determinants of Health   Financial Resource Strain: Not on file  Food Insecurity: Not on file   Transportation Needs: Not on file  Physical Activity: Not on file  Stress: Not on file  Social Connections: Not on file  Intimate Partner Violence: Not on file   Family Status  Relation Name Status  . Mother  Alive  . Father  Deceased at age 24  . Brother  Alive  . MGM  Deceased at age 68  . MGF  Deceased  . PGM  Deceased at age 69  . PGF  Deceased  . Neg Hx  (Not Specified)   Family History  Problem Relation Age of Onset  . Hypertension Mother   . Uterine cancer Mother   . Heart disease Father   . Hypertension Father   . Cancer Father   . Hypertension Brother   . Congestive Heart Failure Maternal Grandmother   . Heart disease Maternal Grandfather   . Congestive Heart Failure Paternal Grandmother   . Stroke Paternal Grandfather   . Breast cancer Neg Hx    Allergies  Allergen Reactions  . Cinnamon     mild Trouble breathing.  . Latex   . Penicillins     Itching    Patient Care Team: Rubye Beach as PCP - General (Family Medicine)   Medications: Outpatient Medications Prior to Visit  Medication Sig  . atorvastatin (LIPITOR) 10 MG tablet TAKE 1 TABLET BY MOUTH EVERY DAY  . azelastine (ASTELIN) 0.1 % nasal spray Place into the nose.  . baclofen (LIORESAL) 10 MG tablet Take by mouth.  . Biotin 1000 MCG CHEW Chew by mouth.  . Cholecalciferol 1000 units capsule Take by mouth.  . famotidine (PEPCID) 20 MG tablet Take 20 mg by mouth 2 (two) times daily.  Marland Kitchen FLAXSEED, LINSEED, PO Take by mouth.  . Ibuprofen 200 MG CAPS Take by mouth.  . Multiple Vitamin tablet Take by mouth.  . sertraline (ZOLOFT) 50 MG tablet Take 1 tablet (50 mg total) by mouth daily.  . traZODone (DESYREL) 50 MG tablet TAKE 1 TABLET BY MOUTH AT  BEDTIME   No facility-administered medications prior to visit.    Review of Systems  Constitutional: Negative.   HENT: Negative.   Eyes: Negative.   Respiratory: Negative.   Cardiovascular: Negative.   Gastrointestinal: Negative.    Endocrine: Negative.   Genitourinary: Negative.   Musculoskeletal: Negative.   Skin: Negative.   Allergic/Immunologic: Negative.   Neurological: Negative.   Hematological: Negative.   Psychiatric/Behavioral: Negative.     Last CBC Lab Results  Component Value Date   WBC 4.6 03/26/2020   HGB 13.3 03/26/2020   HCT 38.6 03/26/2020   MCV 93 03/26/2020   MCH 31.9 03/26/2020   RDW 12.0 03/26/2020   PLT 248 72/53/6644   Last metabolic panel Lab Results  Component Value Date   GLUCOSE 102 (H) 03/26/2020   NA 138 03/26/2020   K 4.2 03/26/2020   CL 100 03/26/2020   CO2 24 03/26/2020   BUN 12 03/26/2020   CREATININE 0.81 03/26/2020   GFRNONAA 80 03/26/2020   GFRAA 93 03/26/2020   CALCIUM 9.2 03/26/2020   PROT 6.8 03/26/2020   ALBUMIN 4.6 03/26/2020   LABGLOB 2.2 03/26/2020   AGRATIO 2.1 03/26/2020   BILITOT 0.3 03/26/2020   ALKPHOS 144 (H) 03/26/2020   AST 32 03/26/2020   ALT 35 (H) 03/26/2020      Objective    There were no vitals taken for this visit. BP Readings from Last 3 Encounters:  08/12/20 (!) 153/109  03/26/20 (!) 145/86  08/29/19 130/89   Wt Readings from Last 3 Encounters:  08/12/20 185 lb 9.6 oz (84.2 kg)  03/26/20 183 lb (83 kg)  08/29/19 182 lb (82.6 kg)      Physical Exam Vitals reviewed.  Constitutional:      General: She is not in acute distress.    Appearance: Normal appearance. She is well-developed. She is not ill-appearing or diaphoretic.  HENT:     Head: Normocephalic and atraumatic.     Right Ear: Tympanic membrane, ear canal and external ear normal.     Left Ear: Tympanic membrane, ear canal and external ear normal.     Nose: Nose normal.     Mouth/Throat:     Mouth: Mucous membranes are moist.     Pharynx: Oropharynx is clear. No oropharyngeal exudate.  Eyes:     General: No scleral icterus.       Right eye: No discharge.        Left eye: No discharge.     Extraocular Movements: Extraocular movements intact.      Conjunctiva/sclera: Conjunctivae normal.     Pupils: Pupils are equal, round, and reactive to light.  Neck:     Thyroid: No thyromegaly.     Vascular: No carotid bruit or JVD.  Trachea: No tracheal deviation.  Cardiovascular:     Rate and Rhythm: Normal rate and regular rhythm.     Pulses: Normal pulses.     Heart sounds: Normal heart sounds. No murmur heard. No friction rub. No gallop.   Pulmonary:     Effort: Pulmonary effort is normal. No respiratory distress.     Breath sounds: Normal breath sounds. No wheezing or rales.  Chest:     Chest wall: No tenderness.  Abdominal:     General: Abdomen is flat. Bowel sounds are normal. There is no distension.     Palpations: Abdomen is soft. There is no mass.     Tenderness: There is no abdominal tenderness. There is no guarding or rebound.  Musculoskeletal:        General: No tenderness. Normal range of motion.     Cervical back: Normal range of motion and neck supple. No tenderness.     Right lower leg: No edema.     Left lower leg: No edema.  Lymphadenopathy:     Cervical: No cervical adenopathy.  Skin:    General: Skin is warm and dry.     Capillary Refill: Capillary refill takes less than 2 seconds.     Findings: No rash.  Neurological:     General: No focal deficit present.     Mental Status: She is alert and oriented to person, place, and time. Mental status is at baseline.  Psychiatric:        Mood and Affect: Mood normal.        Behavior: Behavior normal.        Thought Content: Thought content normal.        Judgment: Judgment normal.     Last depression screening scores PHQ 2/9 Scores 03/12/2019 11/30/2017 11/20/2016  PHQ - 2 Score 0 1 0  PHQ- 9 Score - 3 4   Last fall risk screening Fall Risk  03/12/2019  Falls in the past year? 0  Number falls in past yr: 0  Injury with Fall? 0  Follow up Falls evaluation completed   Last Audit-C alcohol use screening Alcohol Use Disorder Test (AUDIT) 03/12/2019  1. How  often do you have a drink containing alcohol? 0  2. How many drinks containing alcohol do you have on a typical day when you are drinking? 0  3. How often do you have six or more drinks on one occasion? 0  AUDIT-C Score 0  Alcohol Brief Interventions/Follow-up AUDIT Score <7 follow-up not indicated   A score of 3 or more in women, and 4 or more in men indicates increased risk for alcohol abuse, EXCEPT if all of the points are from question 1   No results found for any visits on 08/12/20.  Assessment & Plan    Routine Health Maintenance and Physical Exam  Exercise Activities and Dietary recommendations Goals   None     Immunization History  Administered Date(s) Administered  . Influenza,inj,Quad PF,6+ Mos 02/22/2013, 03/13/2015, 03/18/2016, 02/24/2017, 03/20/2018, 03/12/2019, 03/26/2020  . Moderna Sars-Covid-2 Vaccination 08/18/2019, 09/15/2019  . Tdap 03/26/2020    Health Maintenance  Topic Date Due  . COVID-19 Vaccine (3 - Moderna risk 4-dose series) 10/13/2019  . MAMMOGRAM  05/31/2021  . COLONOSCOPY (Pts 45-63yrs Insurance coverage will need to be confirmed)  08/28/2024  . PAP SMEAR-Modifier  03/26/2025  . TETANUS/TDAP  03/26/2030  . INFLUENZA VACCINE  Completed  . Hepatitis C Screening  Completed  . HIV Screening  Completed  .  HPV VACCINES  Aged Out    Discussed health benefits of physical activity, and encouraged her to engage in regular exercise appropriate for her age and condition.  1. Annual physical exam Normal exam. Labs reviewed.   2. Hypercholesterolemia Stable. Diagnosis pulled for medication refill. Continue current medical treatment plan. Will check labs as below and f/u pending results. - atorvastatin (LIPITOR) 10 MG tablet; Take 1 tablet (10 mg total) by mouth daily.  Dispense: 90 tablet; Refill: 3 - Lipid Panel With LDL/HDL Ratio  3. Blood glucose elevated Diet controlled. Will check labs as below and f/u pending results. - HgB A1c  4.  Anxiety Stable. Diagnosis pulled for medication refill. Continue current medical treatment plan. - sertraline (ZOLOFT) 50 MG tablet; Take 1 tablet (50 mg total) by mouth daily.  Dispense: 90 tablet; Refill: 3  5. Primary insomnia Stable. Diagnosis pulled for medication refill. Continue current medical treatment plan. - traZODone (DESYREL) 50 MG tablet; Take 1 tablet (50 mg total) by mouth at bedtime.  Dispense: 90 tablet; Refill: 3   No follow-ups on file.     Reynolds Bowl, PA-C, have reviewed all documentation for this visit. The documentation on 08/27/20 for the exam, diagnosis, procedures, and orders are all accurate and complete.   Rubye Beach  [facility] [phone] (phone) [phone] (fax)  [place]

## 2020-08-12 NOTE — Patient Instructions (Signed)
Preventive Care 59-59 Years Old, Female Preventive care refers to lifestyle choices and visits with your health care provider that can promote health and wellness. This includes:  A yearly physical exam. This is also called an annual wellness visit.  Regular dental and eye exams.  Immunizations.  Screening for certain conditions.  Healthy lifestyle choices, such as: ? Eating a healthy diet. ? Getting regular exercise. ? Not using drugs or products that contain nicotine and tobacco. ? Limiting alcohol use. What can I expect for my preventive care visit? Physical exam Your health care provider will check your:  Height and weight. These may be used to calculate your BMI (body mass index). BMI is a measurement that tells if you are at a healthy weight.  Heart rate and blood pressure.  Body temperature.  Skin for abnormal spots. Counseling Your health care provider may ask you questions about your:  Past medical problems.  Family's medical history.  Alcohol, tobacco, and drug use.  Emotional well-being.  Home life and relationship well-being.  Sexual activity.  Diet, exercise, and sleep habits.  Work and work Statistician.  Access to firearms.  Method of birth control.  Menstrual cycle.  Pregnancy history. What immunizations do I need? Vaccines are usually given at various ages, according to a schedule. Your health care provider will recommend vaccines for you based on your age, medical history, and lifestyle or other factors, such as travel or where you work.   What tests do I need? Blood tests  Lipid and cholesterol levels. These may be checked every 5 years, or more often if you are over 59 years old.  Hepatitis C test.  Hepatitis B test. Screening  Lung cancer screening. You may have this screening every year starting at age 59 if you have a 30-pack-year history of smoking and currently smoke or have quit within the past 15 years.  Colorectal cancer  screening. ? All adults should have this screening starting at age 59 and continuing until age 72. ? Your health care provider may recommend screening at age 46 if you are at increased risk. ? You will have tests every 1-10 years, depending on your results and the type of screening test.  Diabetes screening. ? This is done by checking your blood sugar (glucose) after you have not eaten for a while (fasting). ? You may have this done every 1-3 years.  Mammogram. ? This may be done every 1-2 years. ? Talk with your health care provider about when you should start having regular mammograms. This may depend on whether you have a family history of breast cancer.  BRCA-related cancer screening. This may be done if you have a family history of breast, ovarian, tubal, or peritoneal cancers.  Pelvic exam and Pap test. ? This may be done every 3 years starting at age 59. ? Starting at age 13, this may be done every 5 years if you have a Pap test in combination with an HPV test. Other tests  STD (sexually transmitted disease) testing, if you are at risk.  Bone density scan. This is done to screen for osteoporosis. You may have this scan if you are at high risk for osteoporosis. Talk with your health care provider about your test results, treatment options, and if necessary, the need for more tests. Follow these instructions at home: Eating and drinking  Eat a diet that includes fresh fruits and vegetables, whole grains, lean protein, and low-fat dairy products.  Take vitamin and mineral supplements  as recommended by your health care provider.  Do not drink alcohol if: ? Your health care provider tells you not to drink. ? You are pregnant, may be pregnant, or are planning to become pregnant.  If you drink alcohol: ? Limit how much you have to 0-1 drink a day. ? Be aware of how much alcohol is in your drink. In the [place] one drink equals one 12 oz bottle of beer (355 mL), one 5 oz glass of  wine (148 mL), or one 1 oz glass of hard liquor (44 mL).   Lifestyle  Take daily care of your teeth and gums. Brush your teeth every morning and night with fluoride toothpaste. Floss one time each day.  Stay active. Exercise for at least 30 minutes 5 or more days each week.  Do not use any products that contain nicotine or tobacco, such as cigarettes, e-cigarettes, and chewing tobacco. If you need help quitting, ask your health care provider.  Do not use drugs.  If you are sexually active, practice safe sex. Use a condom or other form of protection to prevent STIs (sexually transmitted infections).  If you do not wish to become pregnant, use a form of birth control. If you plan to become pregnant, see your health care provider for a prepregnancy visit.  If told by your health care provider, take low-dose aspirin daily starting at age 59.  Find healthy ways to cope with stress, such as: ? Meditation, yoga, or listening to music. ? Journaling. ? Talking to a trusted person. ? Spending time with friends and family. Safety  Always wear your seat belt while driving or riding in a vehicle.  Do not drive: ? If you have been drinking alcohol. Do not ride with someone who has been drinking. ? When you are tired or distracted. ? While texting.  Wear a helmet and other protective equipment during sports activities.  If you have firearms in your house, make sure you follow all gun safety procedures. What's next?  Visit your health care provider once a year for an annual wellness visit.  Ask your health care provider how often you should have your eyes and teeth checked.  Stay up to date on all vaccines. This information is not intended to replace advice given to you by your health care provider. Make sure you discuss any questions you have with your health care provider. Document Revised: 02/10/2020 Document Reviewed: 01/17/2018 Elsevier Patient Education  2021 [employer].

## 2020-08-14 LAB — LIPID PANEL WITH LDL/HDL RATIO
Cholesterol, Total: 154 mg/dL (ref 100–199)
HDL: 51 mg/dL (ref >39)
LDL Chol Calc (NIH): 80 mg/dL (ref 0–99)
LDL/HDL Ratio: 1.6 ratio (ref 0.0–3.2)
Triglycerides: 132 mg/dL (ref 0–149)
VLDL Cholesterol Cal: 23 mg/dL (ref 5–40)

## 2020-08-14 LAB — HEMOGLOBIN A1C
Est. average glucose Bld gHb Est-mCnc: 126 mg/dL
Hgb A1c MFr Bld: 6 % — ABNORMAL HIGH (ref 4.8–5.6)

## 2020-08-16 ENCOUNTER — Telehealth: Payer: Self-pay | Admitting: Physician Assistant

## 2020-08-16 DIAGNOSIS — I10 Essential (primary) hypertension: Secondary | ICD-10-CM

## 2020-08-16 MED ORDER — TRIAMTERENE-HCTZ 37.5-25 MG PO TABS: 1.0000 | ORAL_TABLET | Freq: Every day | ORAL | 3 refills | Status: DC

## 2020-08-16 NOTE — Telephone Encounter (Signed)
Will send in Maxzide for HTN.

## 2020-08-16 NOTE — Telephone Encounter (Signed)
Pt is calling in to update provider with her blood pressure readings. Pt says that she was told to update provider.    Pt says that for the most part her top number has been above 140 for the last few days and the bottom number has been below 90.    Please assist pt further.

## 2020-08-17 ENCOUNTER — Telehealth: Payer: Self-pay

## 2020-08-17 NOTE — Telephone Encounter (Signed)
Pt advised.   Thanks,   -Bernice Mcauliffe  

## 2020-08-17 NOTE — Telephone Encounter (Signed)
Pt advised.   Thanks,   -Clearence Vitug  

## 2020-08-17 NOTE — Telephone Encounter (Signed)
-----   Message from Mar Daring, [place] sent at 08/16/2020  8:34 AM EDT ----- Otila Kluver,  Cholesterol is much improved and doing well. A1c is stable at 6.0.  Best Wishes, Grace Bushy, PAC

## 2020-08-27 ENCOUNTER — Encounter: Payer: Self-pay | Admitting: Physician Assistant

## 2020-12-28 ENCOUNTER — Encounter: Payer: Managed Care, Other (non HMO) | Admitting: Dermatology

## 2021-01-27 ENCOUNTER — Other Ambulatory Visit: Payer: Self-pay

## 2021-01-27 ENCOUNTER — Ambulatory Visit: Payer: BC Managed Care – PPO | Admitting: Dermatology

## 2021-01-27 DIAGNOSIS — L814 Other melanin hyperpigmentation: Secondary | ICD-10-CM

## 2021-01-27 DIAGNOSIS — D1800 Hemangioma unspecified site: Secondary | ICD-10-CM

## 2021-01-27 DIAGNOSIS — Z1283 Encounter for screening for malignant neoplasm of skin: Secondary | ICD-10-CM | POA: Diagnosis not present

## 2021-01-27 DIAGNOSIS — L820 Inflamed seborrheic keratosis: Secondary | ICD-10-CM

## 2021-01-27 DIAGNOSIS — L578 Other skin changes due to chronic exposure to nonionizing radiation: Secondary | ICD-10-CM | POA: Diagnosis not present

## 2021-01-27 DIAGNOSIS — L821 Other seborrheic keratosis: Secondary | ICD-10-CM

## 2021-01-27 DIAGNOSIS — L300 Nummular dermatitis: Secondary | ICD-10-CM | POA: Diagnosis not present

## 2021-01-27 DIAGNOSIS — D229 Melanocytic nevi, unspecified: Secondary | ICD-10-CM

## 2021-01-27 MED ORDER — MOMETASONE FUROATE 0.1 % EX CREA
1.0000 | TOPICAL_CREAM | Freq: Every day | CUTANEOUS | 1 refills | Status: AC | PRN
Start: 2021-01-27 — End: ?

## 2021-01-27 NOTE — Progress Notes (Signed)
Follow-Up Visit   Subjective  Rachel Ali is a 60 y.o. female who presents for the following: Annual Exam (Patient here for full body skin exam and skin cancer screening. Patient with no h/o skin cancer. She does have a spot at left inner arm that feels scaly, a spot at left knee. Patient also has an itchy area at right elbow that may be eczema and patient has been using CeraVe and Dermasil on it. ).  Spots on arm and knee are irritating.    The following portions of the chart were reviewed this encounter and updated as appropriate:       Review of Systems:  No other skin or systemic complaints except as noted in HPI or Assessment and Plan.  Objective  Well appearing patient in no apparent distress; mood and affect are within normal limits.  A full examination was performed including scalp, head, eyes, ears, nose, lips, neck, chest, axillae, abdomen, back, buttocks, bilateral upper extremities, bilateral lower extremities, hands, feet, fingers, toes, fingernails, and toenails. All findings within normal limits unless otherwise noted below.  Right Forearm - Anterior Mild lichenification at right elbow, left mid back  left upper arm x 1, left medial knee x 1, right forearm x 1 (3) Erythematous keratotic or waxy stuck-on papule    Assessment & Plan  Nummular dermatitis Right Forearm - Anterior  Start mometasone cream once daily to affected area as needed for dermatitis. Avoid applying to face, groin, and axilla. Use as directed. Risk of skin atrophy with long-term use reviewed.   Recommend mild soap and moisturizing cream 1-2 times daily.  Gentle skin care handout provided.    Topical steroids (such as triamcinolone, fluocinolone, fluocinonide, mometasone, clobetasol, halobetasol, betamethasone, hydrocortisone) can cause thinning and lightening of the skin if they are used for too long in the same area. Your physician has selected the right strength medicine for your problem and area  affected on the body. Please use your medication only as directed by your physician to prevent side effects.    mometasone (ELOCON) 0.1 % cream - Right Forearm - Anterior Apply 1 application topically daily as needed (Rash). Avoid applying to face, groin, and axilla. Use as directed. Risk of skin atrophy with long-term use reviewed.  Inflamed seborrheic keratosis left upper arm x 1, left medial knee x 1, right forearm x 1  Destruction of lesion - left upper arm x 1, left medial knee x 1, right forearm x 1  Destruction method: cryotherapy   Informed consent: discussed and consent obtained   Lesion destroyed using liquid nitrogen: Yes   Region frozen until ice ball extended beyond lesion: Yes   Outcome: patient tolerated procedure well with no complications   Post-procedure details: wound care instructions given   Additional details:  Prior to procedure, discussed risks of blister formation, small wound, skin dyspigmentation, or rare scar following cryotherapy. Recommend Vaseline ointment to treated areas while healing.   Lentigines - Scattered tan macules - Due to sun exposure - Benign-appering, observe - Recommend daily broad spectrum sunscreen SPF 30+ to sun-exposed areas, reapply every 2 hours as needed. - Call for any changes  Seborrheic Keratoses - Stuck-on, waxy, tan-brown papules and/or plaques  - Benign-appearing - Discussed benign etiology and prognosis. - Observe - Call for any changes  Melanocytic Nevi - Tan-brown and/or pink-flesh-colored symmetric macules and papules - Benign appearing on exam today - Observation - Call clinic for new or changing moles - Recommend daily use of  broad spectrum spf 30+ sunscreen to sun-exposed areas.   Hemangiomas - Red papules - Discussed benign nature - Observe - Call for any changes  Actinic Damage - Chronic condition, secondary to cumulative UV/sun exposure - diffuse scaly erythematous macules with underlying  dyspigmentation - Recommend daily broad spectrum sunscreen SPF 30+ to sun-exposed areas, reapply every 2 hours as needed.  - Staying in the shade or wearing long sleeves, sun glasses (UVA+UVB protection) and wide brim hats (4-inch brim around the entire circumference of the hat) are also recommended for sun protection.  - Call for new or changing lesions.  Skin cancer screening performed today.  Return in about 1 year (around 01/27/2022) for TBSE.  Graciella Belton, RMA, am acting as scribe for Brendolyn Patty, MD .  Documentation: I have reviewed the above documentation for accuracy and completeness, and I agree with the above.  Brendolyn Patty MD

## 2021-01-27 NOTE — Patient Instructions (Addendum)
Start mometasone cream once daily to affected area as needed for dermatitis. Avoid applying to face, groin, and axilla. Use as directed. Risk of skin atrophy with long-term use reviewed.   Topical steroids (such as triamcinolone, fluocinolone, fluocinonide, mometasone, clobetasol, halobetasol, betamethasone, hydrocortisone) can cause thinning and lightening of the skin if they are used for too long in the same area. Your physician has selected the right strength medicine for your problem and area affected on the body. Please use your medication only as directed by your physician to prevent side effects.   Cryotherapy Aftercare  Wash gently with soap and water everyday.   Apply Vaseline and Band-Aid daily until healed.    Seborrheic Keratosis  What causes seborrheic keratoses? Seborrheic keratoses are harmless, common skin growths that first appear during adult life.  As time goes by, more growths appear.  Some people may develop a large number of them.  Seborrheic keratoses appear on both covered and uncovered body parts.  They are not caused by sunlight.  The tendency to develop seborrheic keratoses can be inherited.  They vary in color from skin-colored to gray, brown, or even black.  They can be either smooth or have a rough, warty surface.   Seborrheic keratoses are superficial and look as if they were stuck on the skin.  Under the microscope this type of keratosis looks like layers upon layers of skin.  That is why at times the top layer may seem to fall off, but the rest of the growth remains and re-grows.    Treatment Seborrheic keratoses do not need to be treated, but can easily be removed in the office.  Seborrheic keratoses often cause symptoms when they rub on clothing or jewelry.  Lesions can be in the way of shaving.  If they become inflamed, they can cause itching, soreness, or burning.  Removal of a seborrheic keratosis can be accomplished by freezing, burning, or surgery. If any  spot bleeds, scabs, or grows rapidly, please return to have it checked, as these can be an indication of a skin cancer.  Melanoma ABCDEs  Melanoma is the most dangerous type of skin cancer, and is the leading cause of death from skin disease.  You are more likely to develop melanoma if you: Have light-colored skin, light-colored eyes, or red or blond hair Spend a lot of time in the sun Tan regularly, either outdoors or in a tanning bed Have had blistering sunburns, especially during childhood Have a close family member who has had a melanoma Have atypical moles or large birthmarks  Early detection of melanoma is key since treatment is typically straightforward and cure rates are extremely high if we catch it early.   The first sign of melanoma is often a change in a mole or a new dark spot.  The ABCDE system is a way of remembering the signs of melanoma.  A for asymmetry:  The two halves do not match. B for border:  The edges of the growth are irregular. C for color:  A mixture of colors are present instead of an even brown color. D for diameter:  Melanomas are usually (but not always) greater than 78m - the size of a pencil eraser. E for evolution:  The spot keeps changing in size, shape, and color.  Please check your skin once per month between visits. You can use a small mirror in front and a large mirror behind you to keep an eye on the back side or your body.  If you see any new or changing lesions before your next follow-up, please call to schedule a visit.  Please continue daily skin protection including broad spectrum sunscreen SPF 30+ to sun-exposed areas, reapplying every 2 hours as needed when you're outdoors.    If you have any questions or concerns for your doctor, please call our main line at [phone] and press option 4 to reach your doctor's medical assistant. If no one answers, please leave a voicemail as directed and we will return your call as soon as possible.  Messages left after 4 pm will be answered the following business day.   You may also send [place] a message via [place]. We typically respond to MyChart messages within 1-2 business days.  For prescription refills, please ask your pharmacy to contact our office. Our fax number is [phone].  If you have an urgent issue when the clinic is closed that cannot wait until the next business day, you can page your doctor at the number below.    Please note that while we do our best to be available for urgent issues outside of office hours, we are not available 24/7.   If you have an urgent issue and are unable to reach [place], you may choose to seek medical care at your doctor's office, retail clinic, urgent care center, or emergency room.  If you have a medical emergency, please immediately call 911 or go to the emergency department.  Pager Numbers  - Dr. Nehemiah Massed: [phone]  - Dr. Laurence Ferrari: [phone]  - Dr. Nicole Kindred: [phone]  In the event of inclement weather, please call our main line at [phone] for an update on the status of any delays or closures.  Dermatology Medication Tips: Please keep the boxes that topical medications come in in order to help keep track of the instructions about where and how to use these. Pharmacies typically print the medication instructions only on the boxes and not directly on the medication tubes.   If your medication is too expensive, please contact our office at [phone] option 4 or send [place] a message through [place].   We are unable to tell what your co-pay for medications will be in advance as this is different depending on your insurance coverage. However, we may be able to find a substitute medication at lower cost or fill out paperwork to get insurance to cover a needed medication.   If a prior authorization is required to get your medication covered by your insurance company, please allow [place] 1-2 business days to complete this process.  Drug  prices often vary depending on where the prescription is filled and some pharmacies may offer cheaper prices.  The website www.goodrx.com contains coupons for medications through different pharmacies. The prices here do not account for what the cost may be with help from insurance (it may be cheaper with your insurance), but the website can give you the price if you did not use any insurance.  - You can print the associated coupon and take it with your prescription to the pharmacy.  - You may also stop by our office during regular business hours and pick up a GoodRx coupon card.  - If you need your prescription sent electronically to a different pharmacy, notify our office through [facility] or by phone at [phone] option 4.

## 2021-03-28 ENCOUNTER — Encounter: Payer: Self-pay | Admitting: Physician Assistant

## 2021-04-26 ENCOUNTER — Telehealth: Payer: Self-pay | Admitting: Family Medicine

## 2021-04-26 DIAGNOSIS — Z1239 Encounter for other screening for malignant neoplasm of breast: Secondary | ICD-10-CM

## 2021-04-26 NOTE — Telephone Encounter (Signed)
Referral Request - Has patient seen PCP for this complaint? Received letter *If NO, is insurance requiring patient see PCP for this issue before PCP can refer them?no Referral for which specialty:mammogram Preferred provider/office:[facility] Reason for referral: annual mammogram

## 2021-04-27 NOTE — Telephone Encounter (Signed)
Patient aware that referral was placed.

## 2021-06-01 ENCOUNTER — Ambulatory Visit
Admission: RE | Admit: 2021-06-01 | Discharge: 2021-06-01 | Disposition: A | Discharge status: Home / Self Care | Payer: BC Managed Care – PPO | Source: Ambulatory Visit | Attending: Family Medicine | Admitting: Family Medicine

## 2021-06-01 ENCOUNTER — Other Ambulatory Visit: Payer: Self-pay

## 2021-06-01 DIAGNOSIS — Z1231 Encounter for screening mammogram for malignant neoplasm of breast: Secondary | ICD-10-CM | POA: Insufficient documentation

## 2021-06-22 ENCOUNTER — Other Ambulatory Visit: Payer: Self-pay

## 2021-06-22 ENCOUNTER — Ambulatory Visit (INDEPENDENT_AMBULATORY_CARE_PROVIDER_SITE_OTHER): Payer: BC Managed Care – PPO | Admitting: Family Medicine

## 2021-06-22 VITALS — Temp 98.2°F

## 2021-06-22 DIAGNOSIS — J019 Acute sinusitis, unspecified: Secondary | ICD-10-CM

## 2021-06-22 MED ORDER — DOXYCYCLINE HYCLATE 100 MG PO TABS: 100.0000 mg | ORAL_TABLET | Freq: Two times a day (BID) | ORAL | 0 refills | Status: AC

## 2021-06-22 NOTE — Progress Notes (Signed)
Virtual Visit via Telephone Note  I connected with Rachel Ali on 06/22/21 at 11:40 AM EST by telephone and verified that I am speaking with the correct person using two identifiers.  Location: Patient: home Provider: [facility]   I discussed the limitations, risks, security and privacy concerns of performing an evaluation and management service by telephone and the availability of in person appointments. I also discussed with the patient that there may be a patient responsible charge related to this service. The patient expressed understanding and agreed to proceed.   History of Present Illness:  UPPER RESPIRATORY TRACT INFECTION - symptom onset 06/12/21 with fever, runny nose, cough, congestion. Most symptoms resolved but with worsening in head congestion over the last day or two.  Fever: yes, initially, fever broke 1/27 Cough: yes, productive of phlegm occasionally Shortness of breath: no Chest pain: no Chest tightness: no Chest congestion:  a little Nasal congestion: yes Runny nose: no Sore throat:  a little Sinus pressure: yes Face pain: yes Toothache: no Ear pain: yes bilateral Ear pressure: yes bilateral Vomiting: no Sick contacts: yes Context: worse Recurrent sinusitis: no Relief with OTC cold/cough medications: yes  Treatments attempted:  severe vicks sinus max, alka seltzer plus, dayquil    Observations/Objective:  Patient had trouble connecting to video visit, entirety of visit conducted over the phone.  Speaks in full sentences, no respiratory distress.   Assessment and Plan:  Sinusitis Moderate sx, worsening over the last day. Reasonable to treat with abx given duration of symptoms and recent worsening. No need for COVID/flu testing given duration of symptoms, outside of window for treatment/quarantine. Continue OTC symptom relief. F/u prn.       I discussed the assessment and treatment plan with the patient. The patient was provided an opportunity to ask  questions and all were answered. The patient agreed with the plan and demonstrated an understanding of the instructions.   The patient was advised to call back or seek an in-person evaluation if the symptoms worsen or if the condition fails to improve as anticipated.  I provided 9 minutes of non-face-to-face time during this encounter.   Myles Gip, DO

## 2021-07-27 ENCOUNTER — Other Ambulatory Visit: Payer: Self-pay | Admitting: Physician Assistant

## 2021-07-27 DIAGNOSIS — I10 Essential (primary) hypertension: Secondary | ICD-10-CM

## 2021-08-01 NOTE — Telephone Encounter (Signed)
Pt called to report that she will be completley out in 6 days, please advise. Needs enough to last until scheduled time.  ?

## 2021-08-01 NOTE — Telephone Encounter (Signed)
Requested medications are due for refill today.  yes ? ?Requested medications are on the active medications list.  yes ? ?Last refill. 08/16/2020 90 3 refills ? ?Future visit scheduled.   yes ? ?Notes to clinic.  Labs are expired. - see notes from agent pt is almost out of medication. ? ? ? ?Requested Prescriptions  ?Pending Prescriptions Disp Refills  ? triamterene-hydrochlorothiazide (MAXZIDE-25) 37.5-25 MG tablet [Pharmacy Med Name: TRIAMTERENE-HCTZ 37.5-25 MG TB] 90 tablet 3  ?  Sig: TAKE 1 TABLET BY MOUTH EVERY DAY  ?  ? Cardiovascular: Diuretic Combos Failed - 08/01/2021 10:11 AM  ?  ?  Failed - K in normal range and within 180 days  ?  Potassium  ?Date Value Ref Range Status  ?03/26/2020 4.2 3.5 - 5.2 mmol/L Final  ?  ?  ?  ?  Failed - Na in normal range and within 180 days  ?  Sodium  ?Date Value Ref Range Status  ?03/26/2020 138 134 - 144 mmol/L Final  ?  ?  ?  ?  Failed - Cr in normal range and within 180 days  ?  Creatinine, Ser  ?Date Value Ref Range Status  ?03/26/2020 0.81 0.57 - 1.00 mg/dL Final  ?  ?  ?  ?  Failed - Last BP in normal range  ?  BP Readings from Last 1 Encounters:  ?08/12/20 (!) 153/109  ?  ?  ?  ?  Failed - Valid encounter within last 6 months  ?  Recent Outpatient Visits   ? ?      ? 1 month ago Acute non-recurrent sinusitis, unspecified location  ? [place], DO  ? 11 months ago Annual physical exam  ? [facility] [place], Clearnce Sorrel, [place]  ? 1 year ago Annual physical exam  ? [facility] [place], [place], [place]  ? 2 years ago Annual physical exam  ? [place], [place]  ? 3 years ago Acute non-recurrent pansinusitis  ? [place], [place]  ? ?  ?  ?Future Appointments   ? ?        ? In 1 month Bacigalupo, Dionne Bucy, MD [facility]  ? In 6 months Brendolyn Patty, MD [place]  ? ?  ? ?  ?  ?  ?  ?

## 2021-08-04 ENCOUNTER — Other Ambulatory Visit: Payer: Self-pay | Admitting: Physician Assistant

## 2021-08-04 DIAGNOSIS — F5101 Primary insomnia: Secondary | ICD-10-CM

## 2021-08-04 DIAGNOSIS — F419 Anxiety disorder, unspecified: Secondary | ICD-10-CM

## 2021-08-04 DIAGNOSIS — E7800 Pure hypercholesterolemia, unspecified: Secondary | ICD-10-CM

## 2021-08-31 ENCOUNTER — Other Ambulatory Visit: Payer: Self-pay | Admitting: Family Medicine

## 2021-08-31 DIAGNOSIS — F5101 Primary insomnia: Secondary | ICD-10-CM

## 2021-08-31 DIAGNOSIS — E7800 Pure hypercholesterolemia, unspecified: Secondary | ICD-10-CM

## 2021-08-31 DIAGNOSIS — F419 Anxiety disorder, unspecified: Secondary | ICD-10-CM

## 2021-09-01 NOTE — Telephone Encounter (Signed)
Requested medication (s) are due for refill today: Both meds due 09/08/21 ? ?Requested medication (s) are on the active medication list: yes   ? ?Last refill: Trazodone 08/08/21  #30  0 refills    Zoloft  08/08/21 #30  0 refills ? ?Future visit scheduled yes 09/05/21 ? ?Notes to clinic:Zoloft not delegated  Trazodone not due. Please review. Thank you. Pt has appt Monday 09/05/21, med supply through 09/08/21 ? ?Requested Prescriptions  ?Pending Prescriptions Disp Refills  ? traZODone (DESYREL) 50 MG tablet [Pharmacy Med Name: TRAZODONE 50 MG TABLET] 90 tablet 1  ?  Sig: TAKE 1 TABLET BY MOUTH EVERYDAY AT BEDTIME  ?  ? Psychiatry: Antidepressants - Serotonin Modulator Failed - 08/31/2021  1:34 PM  ?  ?  Failed - Valid encounter within last 6 months  ?  Recent Outpatient Visits   ? ?      ? 2 months ago Acute non-recurrent sinusitis, unspecified location  ? [place], Jake Church, DO  ? 1 year ago Annual physical exam  ? [facility] [place], Clearnce Sorrel, [place]  ? 1 year ago Annual physical exam  ? [facility] [place], Clearnce Sorrel, [place]  ? 2 years ago Annual physical exam  ? [place], [place]  ? 3 years ago Acute non-recurrent pansinusitis  ? [place], [place]  ? ?  ?  ?Future Appointments   ? ?        ? In 4 days Bacigalupo, Dionne Bucy, MD [facility]  ? In 5 months Brendolyn Patty, MD [place]  ? ?  ? ?  ?  ?  ? sertraline (ZOLOFT) 50 MG tablet [Pharmacy Med Name: SERTRALINE HCL 50 MG TABLET] 90 tablet 1  ?  Sig: TAKE 1 TABLET BY MOUTH EVERY DAY  ?  ? Not Delegated - Psychiatry:  Antidepressants - SSRI - sertraline Failed - 08/31/2021  1:34 PM  ?  ?  Failed - This refill cannot be delegated  ?  ?  Failed - AST in normal range and within 360 days  ?  AST  ?Date Value Ref Range Status  ?03/26/2020 32 0 - 40 IU/L Final  ?  ?  ?  ?  Failed - ALT in normal range and within 360 days   ?  ALT  ?Date Value Ref Range Status  ?03/26/2020 35 (H) 0 - 32 IU/L Final  ?  ?  ?  ?  Failed - Completed PHQ-2 or PHQ-9 in the last 360 days  ?  ?  Failed - Valid encounter within last 6 months  ?  Recent Outpatient Visits   ? ?      ? 2 months ago Acute non-recurrent sinusitis, unspecified location  ? [place], Jake Church, DO  ? 1 year ago Annual physical exam  ? [facility] [place], Clearnce Sorrel, [place]  ? 1 year ago Annual physical exam  ? [facility] [place], [place] Sorrel, [place]  ? 2 years ago Annual physical exam  ? [place], [place]  ? 3 years ago Acute non-recurrent pansinusitis  ? [place], [place]  ? ?  ?  ?Future Appointments   ? ?        ? In 4 days Bacigalupo, Dionne Bucy, MD [facility]  ? In 5 months Brendolyn Patty, MD [facility]  ? ?  ? ?  ?  ?  ? ? ? ? ?

## 2021-09-01 NOTE — Telephone Encounter (Signed)
Requested medication (s) are due for refill today: Early ? ?Requested medication (s) are on the active medication list: Yes ? ?Last refill:  08/08/21 ? ?Future visit scheduled: Yes ? ?Notes to clinic:  Last refill should last until up-coming appt. ? ? ? ?Requested Prescriptions  ?Pending Prescriptions Disp Refills  ? atorvastatin (LIPITOR) 10 MG tablet [Pharmacy Med Name: ATORVASTATIN 10 MG TABLET] 30 tablet 0  ?  Sig: TAKE 1 TABLET BY MOUTH EVERY DAY  ?  ? Cardiovascular:  Antilipid - Statins Failed - 08/31/2021  1:33 PM  ?  ?  Failed - Valid encounter within last 12 months  ?  Recent Outpatient Visits   ? ?      ? 2 months ago Acute non-recurrent sinusitis, unspecified location  ? [place], Jake Church, DO  ? 1 year ago Annual physical exam  ? [facility] [place], Clearnce Sorrel, [place]  ? 1 year ago Annual physical exam  ? [facility] [place], Clearnce Sorrel, [place]  ? 2 years ago Annual physical exam  ? [place], [place]  ? 3 years ago Acute non-recurrent pansinusitis  ? [place], [place]  ? ?  ?  ?Future Appointments   ? ?        ? In 4 days Bacigalupo, Dionne Bucy, MD [facility], [facility]  ? In 5 months Brendolyn Patty, MD [place]  ? ?  ? ?  ?  ?  Failed - Lipid Panel in normal range within the last 12 months  ?  Cholesterol, Total  ?Date Value Ref Range Status  ?08/13/2020 154 100 - 199 mg/dL Final  ? ?LDL Chol Calc (NIH)  ?Date Value Ref Range Status  ?08/13/2020 80 0 - 99 mg/dL Final  ? ?HDL  ?Date Value Ref Range Status  ?08/13/2020 51 >39 mg/dL Final  ? ?Triglycerides  ?Date Value Ref Range Status  ?08/13/2020 132 0 - 149 mg/dL Final  ? ?  ?  ?  Passed - Patient is not pregnant  ?  ?  ? ?

## 2021-09-02 NOTE — Progress Notes (Signed)
? ? ?I,Rachel Ali,acting as a scribe for Rachel Paganini, MD.,have documented all relevant documentation on the behalf of Rachel Paganini, MD,as directed by  Rachel Paganini, MD while in the presence of Rachel Paganini, MD. ? ? ?Complete physical exam ? ? ?Patient: Rachel Ali   DOB: [birth year]   61 y.o. Female  MRN: [id number] ?Visit Date: 09/05/2021 ? ?Today's healthcare provider: Lavon Paganini, MD  ? ?Chief Complaint  ?Patient presents with  ? Annual Exam  ? ?Subjective  ?  ?Rachel Ali is a 61 y.o. female who presents today for a complete physical exam.  ?She reports consuming a general diet.     She generally feels fairly well. She reports sleeping well. She does not have additional problems to discuss today.  ?HPI  ? ? ?Past Medical History:  ?Diagnosis Date  ? Allergy   ? sinus problem  ? ?Past Surgical History:  ?Procedure Laterality Date  ? COLONOSCOPY WITH PROPOFOL N/A 08/29/2019  ? Procedure: COLONOSCOPY WITH PROPOFOL;  Surgeon: Lin Landsman, MD;  Location: [facility] ENDOSCOPY;  Service: Gastroenterology;  Laterality: N/A;  ? DILATION AND CURETTAGE OF UTERUS    ? ESSURE TUBAL LIGATION    ? OTHER SURGICAL HISTORY    ? coil placement in tubes to prevent pregnancy-patient could not recall the name of the procedure  ? TONSILLECTOMY    ? TUBAL LIGATION    ? ?Social History  ? ?Socioeconomic History  ? Marital status: Married  ?  Spouse name: Rachel Ali  ? Number of children: 1  ? Years of education: Associates  ? Highest education level: Not on file  ?Occupational History  ?  Employer: [employer]  ?Tobacco Use  ? Smoking status: Former  ?  Packs/day: 0.50  ?  Years: 3.00  ?  Pack years: 1.50  ?  Types: Cigarettes  ?  Quit date: 05/22/1993  ?  Years since quitting: 28.3  ? Smokeless tobacco: Never  ?Vaping Use  ? Vaping Use: Never used  ?Substance and Sexual Activity  ? Alcohol use: No  ?  Comment: rare  ? Drug use: No  ? Sexual activity: Yes  ?  Birth control/protection: Post-menopausal  ?Other Topics  Concern  ? Not on file  ?Social History Narrative  ? Not on file  ? ?Social Determinants of Health  ? ?Financial Resource Strain: Not on file  ?Food Insecurity: Not on file  ?Transportation Needs: Not on file  ?Physical Activity: Not on file  ?Stress: Not on file  ?Social Connections: Not on file  ?Intimate Partner Violence: Not on file  ? ?Family Status  ?Relation Name Status  ? Mother  Alive  ? Father  Deceased at age 34  ? Brother  Alive  ? MGM  Deceased at age 64  ? MGF  Deceased  ? PGM  Deceased at age 68  ? PGF  Deceased  ? Neg Hx  (Not Specified)  ? ?Family History  ?Problem Relation Age of Onset  ? Hypertension Mother   ? Uterine cancer Mother   ? Heart disease Father   ? Hypertension Father   ? Cancer Father   ? Hypertension Brother   ? Congestive Heart Failure Maternal Grandmother   ? Heart disease Maternal Grandfather   ? Congestive Heart Failure Paternal Grandmother   ? Stroke Paternal Grandfather   ? Breast cancer Neg Hx   ? ?Allergies  ?Allergen Reactions  ? Cinnamon   ?  mild Trouble breathing.  ?  Latex   ? Penicillins   ?  Itching  ?  ?Patient Care Team: ?Virginia Crews, MD as PCP - General (Family Medicine)  ? ?Medications: ?Outpatient Medications Prior to Visit  ?Medication Sig  ? atorvastatin (LIPITOR) 10 MG tablet TAKE 1 TABLET BY MOUTH EVERY DAY  ? Cholecalciferol 1000 units capsule Take by mouth.  ? famotidine (PEPCID) 20 MG tablet Take 20 mg by mouth 2 (two) times daily.  ? FLAXSEED, LINSEED, PO Take by mouth.  ? Ibuprofen 200 MG CAPS Take by mouth.  ? mometasone (ELOCON) 0.1 % cream Apply 1 application topically daily as needed (Rash). Avoid applying to face, groin, and axilla. Use as directed. Risk of skin atrophy with long-term use reviewed.  ? Multiple Vitamin tablet Take by mouth.  ? sertraline (ZOLOFT) 50 MG tablet TAKE 1 TABLET BY MOUTH EVERY DAY  ? traZODone (DESYREL) 50 MG tablet TAKE 1 TABLET BY MOUTH EVERYDAY AT BEDTIME  ? triamterene-hydrochlorothiazide (MAXZIDE-25) 37.5-25  MG tablet TAKE 1 TABLET BY MOUTH EVERY DAY  ? [DISCONTINUED] Biotin 1000 MCG CHEW Chew by mouth.  ? ?No facility-administered medications prior to visit.  ? ? ?Review of Systems  ?HENT:  Positive for congestion, rhinorrhea and sneezing.   ?Eyes:  Positive for itching.  ?Allergic/Immunologic: Positive for environmental allergies.  ?Psychiatric/Behavioral:  The patient is nervous/anxious.   ? ?Last CBC ?Lab Results  ?Component Value Date  ? WBC 4.6 03/26/2020  ? HGB 13.3 03/26/2020  ? HCT 38.6 03/26/2020  ? MCV 93 03/26/2020  ? MCH 31.9 03/26/2020  ? RDW 12.0 03/26/2020  ? PLT 248 03/26/2020  ? ?Last metabolic panel ?Lab Results  ?Component Value Date  ? GLUCOSE 102 (H) 03/26/2020  ? NA 138 03/26/2020  ? K 4.2 03/26/2020  ? CL 100 03/26/2020  ? CO2 24 03/26/2020  ? BUN 12 03/26/2020  ? CREATININE 0.81 03/26/2020  ? GFRNONAA 80 03/26/2020  ? CALCIUM 9.2 03/26/2020  ? PROT 6.8 03/26/2020  ? ALBUMIN 4.6 03/26/2020  ? LABGLOB 2.2 03/26/2020  ? AGRATIO 2.1 03/26/2020  ? BILITOT 0.3 03/26/2020  ? ALKPHOS 144 (H) 03/26/2020  ? AST 32 03/26/2020  ? ALT 35 (H) 03/26/2020  ? ?Last lipids ?Lab Results  ?Component Value Date  ? CHOL 154 08/13/2020  ? HDL 51 08/13/2020  ? [place] 80 08/13/2020  ? TRIG 132 08/13/2020  ? CHOLHDL 4.6 (H) 03/26/2020  ? ?Last hemoglobin A1c ?Lab Results  ?Component Value Date  ? HGBA1C 6.0 (H) 08/13/2020  ? ?Last thyroid functions ?Lab Results  ?Component Value Date  ? TSH 1.490 03/26/2020  ? ?  ? Objective  ?  ?BP 130/80 (BP Location: Left Arm, Patient Position: Sitting, Cuff Size: Large)   Pulse 76   Temp 98.5 ?F (36.9 ?C) (Oral)   Resp 16   Ht '5\' 4"'$  (1.626 m)   Wt 185 lb 14.4 oz (84.3 kg)   SpO2 98%   BMI 31.91 kg/m?  ?BP Readings from Last 3 Encounters:  ?09/05/21 130/80  ?08/12/20 (!) 153/109  ?03/26/20 (!) 145/86  ? ?Wt Readings from Last 3 Encounters:  ?09/05/21 185 lb 14.4 oz (84.3 kg)  ?08/12/20 185 lb 9.6 oz (84.2 kg)  ?03/26/20 183 lb (83 kg)  ? ?  ?Physical Exam ?Vitals reviewed.   ?Constitutional:   ?   General: She is not in acute distress. ?   Appearance: Normal appearance. She is well-developed. She is not diaphoretic.  ?HENT:  ?   Head: Normocephalic and  atraumatic.  ?   Right Ear: Tympanic membrane, ear canal and external ear normal.  ?   Left Ear: Tympanic membrane, ear canal and external ear normal.  ?   Nose: Nose normal.  ?   Mouth/Throat:  ?   Mouth: Mucous membranes are moist.  ?   Pharynx: Oropharynx is clear. No oropharyngeal exudate.  ?Eyes:  ?   General: No scleral icterus. ?   Conjunctiva/sclera: Conjunctivae normal.  ?   Pupils: Pupils are equal, round, and reactive to light.  ?Neck:  ?   Thyroid: No thyromegaly.  ?Cardiovascular:  ?   Rate and Rhythm: Normal rate and regular rhythm.  ?   Pulses: Normal pulses.  ?   Heart sounds: Normal heart sounds. No murmur heard. ?Pulmonary:  ?   Effort: Pulmonary effort is normal. No respiratory distress.  ?   Breath sounds: Normal breath sounds. No wheezing or rales.  ?Abdominal:  ?   General: There is no distension.  ?   Palpations: Abdomen is soft.  ?   Tenderness: There is no abdominal tenderness.  ?Musculoskeletal:     ?   General: No deformity.  ?   Cervical back: Neck supple.  ?   Right lower leg: No edema.  ?   Left lower leg: No edema.  ?Lymphadenopathy:  ?   Cervical: No cervical adenopathy.  ?Skin: ?   General: Skin is warm and dry.  ?   Findings: No rash.  ?Neurological:  ?   Mental Status: She is alert and oriented to person, place, and time. Mental status is at baseline.  ?   Gait: Gait normal.  ?Psychiatric:     ?   Mood and Affect: Mood normal.     ?   Behavior: Behavior normal.     ?   Thought Content: Thought content normal.  ?  ? ? ?Last depression screening scores ? ?  09/05/2021  ?  1:58 PM 08/12/2020  ?  4:05 PM 03/12/2019  ?  1:41 PM  ?PHQ 2/9 Scores  ?PHQ - 2 Score 0 0 0  ?PHQ- 9 Score 2 1   ? ?Last fall risk screening ? ?  09/05/2021  ?  1:58 PM  ?Fall Risk   ?Falls in the past year? 0  ?Number falls in past yr: 0   ?Injury with Fall? 0  ?Risk for fall due to : No Fall Risks  ?Follow up Follow up appointment  ? ?Last Audit-C alcohol use screening ? ?  09/05/2021  ?  1:58 PM  ?Alcohol Use Disorder Test (AUDIT)  ?1. How often d

## 2021-09-05 ENCOUNTER — Ambulatory Visit (INDEPENDENT_AMBULATORY_CARE_PROVIDER_SITE_OTHER): Payer: BC Managed Care – PPO | Admitting: Family Medicine

## 2021-09-05 ENCOUNTER — Encounter: Payer: Self-pay | Admitting: Family Medicine

## 2021-09-05 VITALS — BP 130/80 | HR 76 | Temp 98.5°F | Resp 16 | Ht 64.0 in | Wt 185.9 lb

## 2021-09-05 DIAGNOSIS — M8580 Other specified disorders of bone density and structure, unspecified site: Secondary | ICD-10-CM

## 2021-09-05 DIAGNOSIS — Z23 Encounter for immunization: Secondary | ICD-10-CM | POA: Diagnosis not present

## 2021-09-05 DIAGNOSIS — Z6831 Body mass index (BMI) 31.0-31.9, adult: Secondary | ICD-10-CM

## 2021-09-05 DIAGNOSIS — R7303 Prediabetes: Secondary | ICD-10-CM

## 2021-09-05 DIAGNOSIS — R739 Hyperglycemia, unspecified: Secondary | ICD-10-CM

## 2021-09-05 DIAGNOSIS — G4700 Insomnia, unspecified: Secondary | ICD-10-CM

## 2021-09-05 DIAGNOSIS — F419 Anxiety disorder, unspecified: Secondary | ICD-10-CM

## 2021-09-05 DIAGNOSIS — E6609 Other obesity due to excess calories: Secondary | ICD-10-CM | POA: Diagnosis not present

## 2021-09-05 DIAGNOSIS — E7800 Pure hypercholesterolemia, unspecified: Secondary | ICD-10-CM | POA: Diagnosis not present

## 2021-09-05 DIAGNOSIS — I10 Essential (primary) hypertension: Secondary | ICD-10-CM

## 2021-09-05 DIAGNOSIS — Z0000 Encounter for general adult medical examination without abnormal findings: Secondary | ICD-10-CM

## 2021-09-05 NOTE — Assessment & Plan Note (Signed)
Recheck Vit D ?

## 2021-09-05 NOTE — Assessment & Plan Note (Signed)
Recommend low carb diet °Recheck A1c  °

## 2021-09-05 NOTE — Assessment & Plan Note (Signed)
More stress currently with mother's health ?May consider going up on zoloft - will continue current dose for now ?

## 2021-09-05 NOTE — Assessment & Plan Note (Signed)
Previously well controlled Continue statin Repeat FLP and CMP  

## 2021-09-05 NOTE — Assessment & Plan Note (Signed)
Chronic and well controlled  Continue trazodone prn 

## 2021-09-05 NOTE — Addendum Note (Signed)
Addended by: Shawna Orleans on: 09/05/2021 02:47 PM ? ? Modules accepted: Orders ? ?

## 2021-09-05 NOTE — Assessment & Plan Note (Signed)
Well controlled Continue current medications Recheck metabolic panel F/u in 6 months  

## 2021-09-05 NOTE — Assessment & Plan Note (Signed)
Discussed importance of healthy weight management Discussed diet and exercise  

## 2021-09-07 LAB — LIPID PANEL
Chol/HDL Ratio: 3 ratio (ref 0.0–4.4)
Cholesterol, Total: 163 mg/dL (ref 100–199)
HDL: 55 mg/dL
LDL Chol Calc (NIH): 79 mg/dL (ref 0–99)
Triglycerides: 173 mg/dL — ABNORMAL HIGH (ref 0–149)
VLDL Cholesterol Cal: 29 mg/dL (ref 5–40)

## 2021-09-07 LAB — COMPREHENSIVE METABOLIC PANEL WITH GFR
ALT: 35 IU/L — ABNORMAL HIGH (ref 0–32)
AST: 29 IU/L (ref 0–40)
Albumin/Globulin Ratio: 2 (ref 1.2–2.2)
Albumin: 4.7 g/dL (ref 3.8–4.9)
Alkaline Phosphatase: 146 IU/L — ABNORMAL HIGH (ref 44–121)
BUN/Creatinine Ratio: 17 (ref 9–23)
BUN: 17 mg/dL (ref 6–24)
Bilirubin Total: 0.3 mg/dL (ref 0.0–1.2)
CO2: 24 mmol/L (ref 20–29)
Calcium: 9.6 mg/dL (ref 8.7–10.2)
Chloride: 101 mmol/L (ref 96–106)
Creatinine, Ser: 1.01 mg/dL — ABNORMAL HIGH (ref 0.57–1.00)
Globulin, Total: 2.3 g/dL (ref 1.5–4.5)
Glucose: 110 mg/dL — ABNORMAL HIGH (ref 70–99)
Potassium: 3.8 mmol/L (ref 3.5–5.2)
Sodium: 142 mmol/L (ref 134–144)
Total Protein: 7 g/dL (ref 6.0–8.5)
eGFR: 64 mL/min/1.73

## 2021-09-07 LAB — HEMOGLOBIN A1C
Est. average glucose Bld gHb Est-mCnc: 128 mg/dL
Hgb A1c MFr Bld: 6.1 % — ABNORMAL HIGH (ref 4.8–5.6)

## 2021-09-07 LAB — VITAMIN D 25 HYDROXY (VIT D DEFICIENCY, FRACTURES): Vit D, 25-Hydroxy: 46.7 ng/mL (ref 30.0–100.0)

## 2021-09-27 ENCOUNTER — Other Ambulatory Visit: Payer: Self-pay | Admitting: Family Medicine

## 2021-09-27 DIAGNOSIS — E7800 Pure hypercholesterolemia, unspecified: Secondary | ICD-10-CM

## 2021-10-28 ENCOUNTER — Other Ambulatory Visit: Payer: Self-pay | Admitting: Family Medicine

## 2021-10-28 DIAGNOSIS — I10 Essential (primary) hypertension: Secondary | ICD-10-CM

## 2021-12-02 ENCOUNTER — Other Ambulatory Visit: Payer: Self-pay | Admitting: Family Medicine

## 2021-12-02 DIAGNOSIS — F419 Anxiety disorder, unspecified: Secondary | ICD-10-CM

## 2021-12-02 DIAGNOSIS — F5101 Primary insomnia: Secondary | ICD-10-CM

## 2021-12-02 NOTE — Telephone Encounter (Signed)
Requested Prescriptions  Pending Prescriptions Disp Refills  . sertraline (ZOLOFT) 50 MG tablet [Pharmacy Med Name: SERTRALINE HCL 50 MG TABLET] 90 tablet 0    Sig: TAKE 1 TABLET BY MOUTH EVERY DAY     Psychiatry:  Antidepressants - SSRI - sertraline Failed - 12/02/2021  2:17 AM      Failed - ALT in normal range and within 360 days    ALT  Date Value Ref Range Status  09/06/2021 35 (H) 0 - 32 IU/L Final         Passed - AST in normal range and within 360 days    AST  Date Value Ref Range Status  09/06/2021 29 0 - 40 IU/L Final         Passed - Completed PHQ-2 or PHQ-9 in the last 360 days      Passed - Valid encounter within last 6 months    Recent Outpatient Visits          2 months ago Encounter for annual physical exam   [facility], Dionne Bucy, MD   5 months ago Acute non-recurrent sinusitis, unspecified location   [facility] [place], Jake Church, DO   1 year ago Annual physical exam   [place], Clearnce Sorrel, [place]   1 year ago Annual physical exam   [facility] [place], Clearnce Sorrel, [place]   2 years ago Annual physical exam   [place], Clearnce Sorrel, [place]      Future Appointments            In 2 months Brendolyn Patty, MD [place]   In 3 months [place], Dionne Bucy, MD [facility] [place]           . traZODone ([place]) 50 MG tablet [Pharmacy Med Name: TRAZODONE 50 MG TABLET] 90 tablet 0    Sig: TAKE 1 TABLET BY MOUTH EVERYDAY AT BEDTIME     Psychiatry: Antidepressants - Serotonin Modulator Passed - 12/02/2021  2:17 AM      Passed - Valid encounter within last 6 months    Recent Outpatient Visits          2 months ago Encounter for annual physical exam   [facility] [place], Dionne Bucy, MD   5 months ago Acute non-recurrent sinusitis, unspecified location   [place], Jake Church, DO   1 year ago  Annual physical exam   [facility] [place], Clearnce Sorrel, [place]   1 year ago Annual physical exam   [facility] [place], Clearnce Sorrel, [place]   2 years ago Annual physical exam   [place], Clearnce Sorrel, [place]      Future Appointments            In 2 months Brendolyn Patty, MD [place]   In 3 months [place]igal[place], Dionne Bucy, MD [facility] [place]

## 2021-12-25 ENCOUNTER — Other Ambulatory Visit: Payer: Self-pay | Admitting: Family Medicine

## 2021-12-25 DIAGNOSIS — E7800 Pure hypercholesterolemia, unspecified: Secondary | ICD-10-CM

## 2022-01-24 ENCOUNTER — Other Ambulatory Visit: Payer: Self-pay | Admitting: Family Medicine

## 2022-01-24 DIAGNOSIS — I10 Essential (primary) hypertension: Secondary | ICD-10-CM

## 2022-01-31 ENCOUNTER — Ambulatory Visit: Payer: BC Managed Care – PPO | Admitting: Dermatology

## 2022-01-31 DIAGNOSIS — L300 Nummular dermatitis: Secondary | ICD-10-CM | POA: Diagnosis not present

## 2022-01-31 DIAGNOSIS — L719 Rosacea, unspecified: Secondary | ICD-10-CM | POA: Diagnosis not present

## 2022-01-31 DIAGNOSIS — L821 Other seborrheic keratosis: Secondary | ICD-10-CM

## 2022-01-31 DIAGNOSIS — D1800 Hemangioma unspecified site: Secondary | ICD-10-CM

## 2022-01-31 DIAGNOSIS — Z1283 Encounter for screening for malignant neoplasm of skin: Secondary | ICD-10-CM | POA: Diagnosis not present

## 2022-01-31 DIAGNOSIS — L578 Other skin changes due to chronic exposure to nonionizing radiation: Secondary | ICD-10-CM | POA: Diagnosis not present

## 2022-01-31 DIAGNOSIS — L570 Actinic keratosis: Secondary | ICD-10-CM | POA: Diagnosis not present

## 2022-01-31 DIAGNOSIS — L820 Inflamed seborrheic keratosis: Secondary | ICD-10-CM

## 2022-01-31 DIAGNOSIS — L814 Other melanin hyperpigmentation: Secondary | ICD-10-CM

## 2022-01-31 MED ORDER — FINACEA 15 % EX FOAM: CUTANEOUS | 3 refills | Status: DC

## 2022-01-31 NOTE — Patient Instructions (Addendum)
Finacea Foam (Azelaic acid) - apply to face once to twice daily for rosacea.    Rosacea  What is rosacea? Rosacea (say: ro-zay-sha) is a common skin disease that usually begins as a trend of flushing or blushing easily.  As rosacea progresses, a persistent redness in the center of the face will develop and may gradually spread beyond the nose and cheeks to the forehead and chin.  In some cases, the ears, chest, and back could be affected.  Rosacea may appear as tiny blood vessels or small red bumps that occur in crops.  Frequently they can contain pus, and are called "pustules".  If the bumps do not contain pus, they are referred to as "papules".  Rarely, in prolonged, untreated cases of rosacea, the oil glands of the nose and cheeks may become permanently enlarged.  This is called rhinophyma, and is seen more frequently in men.  Signs and Risks In its beginning stages, rosacea tends to come and go, which makes it difficult to recognize.  It can start as intermittent flushing of the face.  Eventually, blood vessels may become permanently visible.  Pustules and papules can appear, but can be mistaken for adult acne.  People of all races, ages, genders and ethnic groups are at risk of developing rosacea.  However, it is more common in women (especially around menopause) and adults with fair skin between the ages of 68 and 58.  Treatment Dermatologists typically recommend a combination of treatments to effectively manage rosacea.  Treatment can improve symptoms and may stop the progression of the rosacea.  Treatment may involve both topical and oral medications.  The tetracycline antibiotics are often used for their anti-inflammatory effect; however, because of the possibility of developing antibiotic resistance, they should not be used long term at full dose.  For dilated blood vessels the options include electrodessication (uses electric current through a small needle), laser treatment, and cosmetics to  hide the redness.   With all forms of treatment, improvement is a slow process, and patients may not see any results for the first 3-4 weeks.  It is very important to avoid the sun and other triggers.  Patients must wear sunscreen daily.  Skin Care Instructions: Cleanse the skin with a mild soap such as CeraVe cleanser, Cetaphil cleanser, or Dove soap once or twice daily as needed. Moisturize with Eucerin Redness Relief Daily Perfecting Lotion (has a subtle green tint), CeraVe Moisturizing Cream, or Oil of Olay Daily Moisturizer with sunscreen every morning and/or night as recommended. Makeup should be "non-comedogenic" (won't clog pores) and be labeled "for sensitive skin". Good choices for cosmetics are: Neutrogena, Almay, and Physician's Formula.  Any product with a green tint tends to offset a red complexion. If your eyes are dry and irritated, use artificial tears 2-3 times per day and cleanse the eyelids daily with baby shampoo.  Have your eyes examined at least every 2 years.  Be sure to tell your eye doctor that you have rosacea. Alcoholic beverages tend to cause flushing of the skin, and may make rosacea worse. Always wear sunscreen, protect your skin from extreme hot and cold temperatures, and avoid spicy foods, hot drinks, and mechanical irritation such as rubbing, scrubbing, or massaging the face.  Avoid harsh skin cleansers, cleansing masks, astringents, and exfoliation. If a particular product burns or makes your face feel tight, then it is likely to flare your rosacea. If you are having difficulty finding a sunscreen that you can tolerate, you may try switching  to a chemical-free sunscreen.  These are ones whose active ingredient is zinc oxide or titanium dioxide only.  They should also be fragrance free, non-comedogenic, and labeled for sensitive skin. Rosacea triggers may vary from person to person.  There are a variety of foods that have been reported to trigger rosacea.  Some patients  find that keeping a diary of what they were doing when they flared helps them avoid triggers.   Cryotherapy Aftercare  Wash gently with soap and water everyday.   Apply Vaseline and Band-Aid daily until healed.    Recommend starting moisturizer with exfoliant (Urea, Salicylic acid, or Lactic acid) one to two times daily to help smooth rough and bumpy skin.  OTC options include Cetaphil Rough and Bumpy lotion (Urea), Eucerin Roughness Relief lotion or spot treatment cream (Urea), CeraVe SA lotion/cream for Rough and Bumpy skin (Sal Acid), Gold Bond Rough and Bumpy cream (Sal Acid), and AmLactin 12% lotion/cream (Lactic Acid).  If applying in morning, also apply sunscreen to sun-exposed areas, since these exfoliating moisturizers can increase sensitivity to sun.  Continue mometasone cream to affected areas itchy rash once to twice daily as needed for flares. Avoid face, groin, axilla.   Topical steroids (such as triamcinolone, fluocinolone, fluocinonide, mometasone, clobetasol, halobetasol, betamethasone, hydrocortisone) can cause thinning and lightening of the skin if they are used for too long in the same area. Your physician has selected the right strength medicine for your problem and area affected on the body. Please use your medication only as directed by your physician to prevent side effects.   Gentle Skin Care Guide  1. Bathe no more than once a day.  2. Avoid bathing in hot water  3. Use a mild soap like Dove, Vanicream, Cetaphil, CeraVe. Can use Lever 2000 or Cetaphil antibacterial soap  4. Use soap only where you need it. On most days, use it under your arms, between your legs, and on your feet. Let the water rinse other areas unless visibly dirty.  5. When you get out of the bath/shower, use a towel to gently blot your skin dry, don't rub it.  6. While your skin is still a little damp, apply a moisturizing cream such as Vanicream, CeraVe, Cetaphil, Eucerin, Sarna lotion or plain  Vaseline Jelly. For hands apply Neutrogena [place] Hand Cream or Excipial Hand Cream.  7. Reapply moisturizer any time you start to itch or feel dry.  8. Sometimes using free and clear laundry detergents can be helpful. Fabric softener sheets should be avoided. Downy Free & Gentle liquid, or any liquid fabric softener that is free of dyes and perfumes, it acceptable to use  9. If your doctor has given you prescription creams you may apply moisturizers over them     Seborrheic Keratosis  What causes seborrheic keratoses? Seborrheic keratoses are harmless, common skin growths that first appear during adult life.  As time goes by, more growths appear.  Some people may develop a large number of them.  Seborrheic keratoses appear on both covered and uncovered body parts.  They are not caused by sunlight.  The tendency to develop seborrheic keratoses can be inherited.  They vary in color from skin-colored to gray, brown, or even black.  They can be either smooth or have a rough, warty surface.   Seborrheic keratoses are superficial and look as if they were stuck on the skin.  Under the microscope this type of keratosis looks like layers upon layers of skin.  That is why  at times the top layer may seem to fall off, but the rest of the growth remains and re-grows.    Treatment Seborrheic keratoses do not need to be treated, but can easily be removed in the office.  Seborrheic keratoses often cause symptoms when they rub on clothing or jewelry.  Lesions can be in the way of shaving.  If they become inflamed, they can cause itching, soreness, or burning.  Removal of a seborrheic keratosis can be accomplished by freezing, burning, or surgery. If any spot bleeds, scabs, or grows rapidly, please return to have it checked, as these can be an indication of a skin cancer.  Due to recent changes in healthcare laws, you may see results of your pathology and/or laboratory studies on MyChart before the doctors  have had a chance to review them. We understand that in some cases there may be results that are confusing or concerning to you. Please understand that not all results are received at the same time and often the doctors may need to interpret multiple results in order to provide you with the best plan of care or course of treatment. Therefore, we ask that you please give [place] 2 business days to thoroughly review all your results before contacting the office for clarification. Should we see a critical lab result, you will be contacted sooner.   If You Need Anything After Your Visit  If you have any questions or concerns for your doctor, please call our main line at [phone] and press option 4 to reach your doctor's medical assistant. If no one answers, please leave a voicemail as directed and we will return your call as soon as possible. Messages left after 4 pm will be answered the following business day.   You may also send [place] a message via [place]. We typically respond to MyChart messages within 1-2 business days.  For prescription refills, please ask your pharmacy to contact our office. Our fax number is [phone].  If you have an urgent issue when the clinic is closed that cannot wait until the next business day, you can page your doctor at the number below.    Please note that while we do our best to be available for urgent issues outside of office hours, we are not available 24/7.   If you have an urgent issue and are unable to reach [place], you may choose to seek medical care at your doctor's office, retail clinic, urgent care center, or emergency room.  If you have a medical emergency, please immediately call 911 or go to the emergency department.  Pager Numbers  - Dr. Nehemiah Massed: [phone]  - Dr. Laurence Ferrari: [phone]  - Dr. Nicole Kindred: [phone]  In the event of inclement weather, please call our main line at [phone] for an update on the status of any delays or  closures.  Dermatology Medication Tips: Please keep the boxes that topical medications come in in order to help keep track of the instructions about where and how to use these. Pharmacies typically print the medication instructions only on the boxes and not directly on the medication tubes.   If your medication is too expensive, please contact our office at [phone] option 4 or send [place] a message through [place].   We are unable to tell what your co-pay for medications will be in advance as this is different depending on your insurance coverage. However, we may be able to find a substitute medication at lower cost or fill out paperwork to get  insurance to cover a needed medication.   If a prior authorization is required to get your medication covered by your insurance company, please allow [place] 1-2 business days to complete this process.  Drug prices often vary depending on where the prescription is filled and some pharmacies may offer cheaper prices.  The website www.goodrx.com contains coupons for medications through different pharmacies. The prices here do not account for what the cost may be with help from insurance (it may be cheaper with your insurance), but the website can give you the price if you did not use any insurance.  - You can print the associated coupon and take it with your prescription to the pharmacy.  - You may also stop by our office during regular business hours and pick up a GoodRx coupon card.  - If you need your prescription sent electronically to a different pharmacy, notify our office through [facility] or by phone at [phone] option 4.     Si Usted Necesita Algo Despus de Su Visita  Tambin puede enviarnos un mensaje a travs de Pharmacist, community. Por lo general respondemos a los mensajes de MyChart en el transcurso de 1 a 2 das hbiles.  Para renovar recetas, por favor pida a su farmacia que se ponga en contacto con nuestra oficina. Harland Dingwall de fax  es [place] [phone].  Si tiene un asunto urgente cuando la clnica est cerrada y que no puede esperar hasta el siguiente da hbil, puede llamar/localizar a su doctor(a) al nmero que aparece a continuacin.   Por favor, tenga en cuenta que aunque hacemos todo lo posible para estar disponibles para asuntos urgentes fuera del horario de [place], no estamos disponibles las 24 horas del da, los 7 das de la [place].   Si tiene un problema urgente y no puede comunicarse con nosotros, puede optar por buscar atencin mdica  en el consultorio de su doctor(a), en una clnica privada, en un centro de atencin urgente o en una sala de emergencias.  Si tiene Engineering geologist, por favor llame inmediatamente al 911 o vaya a la sala de emergencias.  Nmeros de bper  - Dr. Nehemiah Massed: [phone]  - Dra. Moye: [phone]  - Dra. Nicole Kindred: [phone]  En caso de inclemencias del [place], por favor llame a Johnsie Kindred principal al [phone] para una actualizacin sobre el [place] de cualquier retraso o cierre.  Consejos para la medicacin en dermatologa: Por favor, guarde las cajas en las que vienen los medicamentos de uso tpico para ayudarle a seguir las instrucciones sobre dnde y cmo usarlos. Las farmacias generalmente imprimen las instrucciones del medicamento slo en las cajas y no directamente en los tubos del [place].   Si su medicamento es muy caro, por favor, pngase en contacto con Zigmund Daniel llamando al [phone] y presione la opcin 4 o envenos un mensaje a travs de Pharmacist, community.   No podemos decirle cul ser su copago por los medicamentos por adelantado ya que esto es diferente dependiendo de la cobertura de su seguro. Sin embargo, es posible que podamos encontrar un medicamento sustituto a Electrical engineer un formulario para que el seguro cubra el medicamento que se considera necesario.   Si se requiere una autorizacin previa para que su compaa de seguros [place]  su medicamento, por favor permtanos de 1 a 2 das hbiles para completar este proceso.  Los precios de los medicamentos varan con frecuencia dependiendo del Environmental consultant de dnde se surte la receta y Environmental health practitioner pueden ofrecer  precios ms baratos.  El sitio web www.goodrx.com tiene cupones para medicamentos de Airline pilot. Los precios aqu no tienen en cuenta lo que podra costar con la ayuda del seguro (puede ser ms barato con su seguro), pero el sitio web puede darle el precio si no utiliz Research scientist (physical sciences).  - Puede imprimir el cupn correspondiente y llevarlo con su receta a la farmacia.  - Tambin puede pasar por nuestra oficina durante el horario de atencin regular y Charity fundraiser una tarjeta de cupones de GoodRx.  - Si necesita que su receta se enve electrnicamente a una farmacia diferente, informe a nuestra oficina a travs de MyChart de [place] o por telfono llamando al [phone] y presione la opcin 4.

## 2022-01-31 NOTE — Progress Notes (Signed)
Follow-Up Visit   Subjective  Rachel Ali is a 61 y.o. female who presents for the following: Annual Exam.  The patient presents for Total-Body Skin Exam (TBSE) for skin cancer screening and mole check.  The patient has spots, moles and lesions to be evaluated, some may be new or changing. She has a spot on her left upper lip that she noticed a couple months ago. She scratches and has bled, also a little sore. She has a history of nummular dermatitis and is controlled with mometasone cream. No history of skin cancer. She also gets redness and bumps on the face   The following portions of the chart were reviewed this encounter and updated as appropriate:       Review of Systems:  No other skin or systemic complaints except as noted in HPI or Assessment and Plan.  Objective  Well appearing patient in no apparent distress; mood and affect are within normal limits.  A full examination was performed including scalp, head, eyes, ears, nose, lips, neck, chest, axillae, abdomen, back, buttocks, bilateral upper extremities, bilateral lower extremities, hands, feet, fingers, toes, fingernails, and toenails. All findings within normal limits unless otherwise noted below.  L upper lip at vermillion edge Pink slightly scaly macule  R mid back Pink scaly patch  L post thigh, L upper arm above elbow x 1 Pink brown thin papule (arm), pink scaly macule (thigh)  face Mid face erythema with telangiectasias +/- scattered inflammatory papules.     Assessment & Plan  Skin cancer screening performed today.  Actinic Damage - chronic, secondary to cumulative UV radiation exposure/sun exposure over time - diffuse scaly erythematous macules with underlying dyspigmentation - Recommend daily broad spectrum sunscreen SPF 30+ to sun-exposed areas, reapply every 2 hours as needed.  - Recommend staying in the shade or wearing long sleeves, sun glasses (UVA+UVB protection) and wide brim hats (4-inch brim  around the entire circumference of the hat). - Call for new or changing lesions.  Hemangiomas - Red papules - Discussed benign nature - Observe - Call for any changes  Lentigines - Scattered tan macules - Due to sun exposure - Benign-appering, observe - Recommend daily broad spectrum sunscreen SPF 30+ to sun-exposed areas, reapply every 2 hours as needed. - Call for any changes  Seborrheic Keratoses - Stuck-on, waxy, tan-brown papules and/or plaques  - Benign-appearing - Discussed benign etiology and prognosis. - Observe - Call for any changes - Recommend starting moisturizer with exfoliant (Urea, Salicylic acid, or Lactic acid) one to two times daily to help smooth rough and bumpy skin.  OTC options include Cetaphil Rough and Bumpy lotion (Urea), Eucerin Roughness Relief lotion or spot treatment cream (Urea), CeraVe SA lotion/cream for Rough and Bumpy skin (Sal Acid), Gold Bond Rough and Bumpy cream (Sal Acid), and AmLactin 12% lotion/cream (Lactic Acid).  If applying in morning, also apply sunscreen to sun-exposed areas, since these exfoliating moisturizers can increase sensitivity to sun. - Sample of AmLactin lotion   AK (actinic keratosis) L upper lip at vermillion edge  vs ISK - pt to return to office if doesn't clear  Actinic keratoses are precancerous spots that appear secondary to cumulative UV radiation exposure/sun exposure over time. They are chronic with expected duration over 1 year. A portion of actinic keratoses will progress to squamous cell carcinoma of the skin. It is not possible to reliably predict which spots will progress to skin cancer and so treatment is recommended to prevent development of skin  cancer.  Recommend daily broad spectrum sunscreen SPF 30+ to sun-exposed areas, reapply every 2 hours as needed.  Recommend staying in the shade or wearing long sleeves, sun glasses (UVA+UVB protection) and wide brim hats (4-inch brim around the entire circumference  of the hat). Call for new or changing lesions.  Destruction of lesion - L upper lip at vermillion edge  Destruction method: cryotherapy   Informed consent: discussed and consent obtained   Lesion destroyed using liquid nitrogen: Yes   Region frozen until ice ball extended beyond lesion: Yes   Outcome: patient tolerated procedure well with no complications   Post-procedure details: wound care instructions given   Additional details:  Prior to procedure, discussed risks of blister formation, small wound, skin dyspigmentation, or rare scar following cryotherapy. Recommend Vaseline ointment to treated areas while healing.   Nummular dermatitis R mid back  Recommend mild soap and moisturizing cream 1-2 times daily.  Gentle skin care handout provided.   Continue mometasone cream qd/bid prn flares. Avoid face, groin, axilla. Patient will call for refills.   Topical steroids (such as triamcinolone, fluocinolone, fluocinonide, mometasone, clobetasol, halobetasol, betamethasone, hydrocortisone) can cause thinning and lightening of the skin if they are used for too long in the same area. Your physician has selected the right strength medicine for your problem and area affected on the body. Please use your medication only as directed by your physician to prevent side effects.    Related Medications mometasone (ELOCON) 0.1 % cream Apply 1 application topically daily as needed (Rash). Avoid applying to face, groin, and axilla. Use as directed. Risk of skin atrophy with long-term use reviewed.  Inflamed seborrheic keratosis L post thigh, L upper arm above elbow x 1  Left post thigh recently scratched off. No treatment today, asymptomatic. LN2 to L upper arm only.  Destruction of lesion - L post thigh, L upper arm above elbow x 1  Destruction method: cryotherapy   Informed consent: discussed and consent obtained   Lesion destroyed using liquid nitrogen: Yes   Region frozen until ice ball  extended beyond lesion: Yes   Outcome: patient tolerated procedure well with no complications   Post-procedure details: wound care instructions given   Additional details:  Prior to procedure, discussed risks of blister formation, small wound, skin dyspigmentation, or rare scar following cryotherapy. Recommend Vaseline ointment to treated areas while healing.   Rosacea face  Chronic and persistent condition with duration or expected duration over one year. Condition is bothersome/symptomatic for patient. Currently flared.   Rosacea is a chronic progressive skin condition usually affecting the face of adults, causing redness and/or acne bumps. It is treatable but not curable. It sometimes affects the eyes (ocular rosacea) as well. It may respond to topical and/or systemic medication and can flare with stress, sun exposure, alcohol, exercise and some foods.  Daily application of broad spectrum spf 30+ sunscreen to face is recommended to reduce flares.  Start Finacea Foam - Apply qd/bid face for rosacea dsp 50g 3Rf.   Azelaic Acid (FINACEA) 15 % FOAM - face Apply to face once to twice a day for rosacea.   Return in about 1 year (around 02/01/2023) for TBSE.  IJamesetta Orleans, CMA, am acting as scribe for Brendolyn Patty, MD .  Documentation: I have reviewed the above documentation for accuracy and completeness, and I agree with the above.  Brendolyn Patty MD

## 2022-03-01 ENCOUNTER — Other Ambulatory Visit: Payer: Self-pay | Admitting: Family Medicine

## 2022-03-01 DIAGNOSIS — F5101 Primary insomnia: Secondary | ICD-10-CM

## 2022-03-01 DIAGNOSIS — F419 Anxiety disorder, unspecified: Secondary | ICD-10-CM

## 2022-03-01 NOTE — Telephone Encounter (Signed)
Requested Prescriptions  Pending Prescriptions Disp Refills  . traZODone (DESYREL) 50 MG tablet [Pharmacy Med Name: TRAZODONE 50 MG TABLET] 90 tablet 0    Sig: TAKE 1 TABLET BY MOUTH EVERYDAY AT BEDTIME     Psychiatry: Antidepressants - Serotonin Modulator Passed - 03/01/2022  2:08 AM      Passed - Valid encounter within last 6 months    Recent Outpatient Visits          5 months ago Encounter for annual physical exam   [facility] [place], Dionne Bucy, MD   8 months ago Acute non-recurrent sinusitis, unspecified location   [place], Jake Church, DO   1 year ago Annual physical exam   [place], Clearnce Sorrel, [place]   1 year ago Annual physical exam   [facility] [place], Clearnce Sorrel, [place]   2 years ago Annual physical exam   [place], Clearnce Sorrel, [place]      Future Appointments            In 1 week Bacigalupo, Dionne Bucy, MD [facility], [place]           . sertraline (ZOLOFT) 50 MG tablet [Pharmacy Med Name: SERTRALINE HCL 50 MG TABLET] 90 tablet 0    Sig: TAKE 1 TABLET BY MOUTH EVERY DAY     Psychiatry:  Antidepressants - SSRI - sertraline Failed - 03/01/2022  2:08 AM      Failed - ALT in normal range and within 360 days    ALT  Date Value Ref Range Status  09/06/2021 35 (H) 0 - 32 IU/L Final         Passed - AST in normal range and within 360 days    AST  Date Value Ref Range Status  09/06/2021 29 0 - 40 IU/L Final         Passed - Completed PHQ-2 or PHQ-9 in the last 360 days      Passed - Valid encounter within last 6 months    Recent Outpatient Visits          5 months ago Encounter for annual physical exam   [facility] [place], Dionne Bucy, MD   8 months ago Acute non-recurrent sinusitis, unspecified location   [place], Jake Church, DO   1 year ago Annual physical exam   [place], Clearnce Sorrel, [place]   1 year ago Annual physical exam   [facility] [place], Clearnce Sorrel, [place]   2 years ago Annual physical exam   [facility] [place], Clearnce Sorrel, [place]      Future Appointments            In 1 week Bacigalupo, Dionne Bucy, MD [facility], [place]

## 2022-03-08 NOTE — Progress Notes (Unsigned)
I,Johnica Armwood S Eriyah Fernando,acting as a scribe for Shirlee Latch, MD.,have documented all relevant documentation on the behalf of Shirlee Latch, MD,as directed by  Shirlee Latch, MD while in the presence of Shirlee Latch, MD.     Established patient visit   Patient: Rachel Ali   DOB: 06-02-1960   61 y.o. Female  MRN: [id number] Visit Date: 03/09/2022  Today's healthcare provider: Shirlee Latch, MD   No chief complaint on file.  Subjective    HPI  Hypertension, follow-up  BP Readings from Last 3 Encounters:  09/05/21 130/80  08/12/20 (!) 153/109  03/26/20 (!) 145/86   Wt Readings from Last 3 Encounters:  09/05/21 185 lb 14.4 oz (84.3 kg)  08/12/20 185 lb 9.6 oz (84.2 kg)  03/26/20 183 lb (83 kg)     She was last seen for hypertension 6 months ago.  BP at that visit was 130/80. Management since that visit includes no changes.  She reports {excellent/good/fair/poor:19665} compliance with treatment. She {is/is not:9024} having side effects. {document side effects if present:1} She is following a {diet:21022986} diet. She {is/is not:9024} exercising. She {does/does not:200015} smoke.  Use of agents associated with hypertension: {bp agents assoc with hypertension:511::"none"}.   Outside blood pressures are {***enter patient reported home BP readings, or 'not being checked':1}. Symptoms: {Yes/No:20286} chest pain {Yes/No:20286} chest pressure  {Yes/No:20286} palpitations {Yes/No:20286} syncope  {Yes/No:20286} dyspnea {Yes/No:20286} orthopnea  {Yes/No:20286} paroxysmal nocturnal dyspnea {Yes/No:20286} lower extremity edema   Pertinent labs Lab Results  Component Value Date   CHOL 163 09/06/2021   HDL 55 09/06/2021   LDLCALC 79 09/06/2021   TRIG 173 (H) 09/06/2021   CHOLHDL 3.0 09/06/2021   Lab Results  Component Value Date   NA 142 09/06/2021   K 3.8 09/06/2021   CREATININE 1.01 (H) 09/06/2021   EGFR 64 09/06/2021   GLUCOSE 110 (H) 09/06/2021   TSH  1.490 03/26/2020     The 10-year ASCVD risk score (Arnett DK, et al., 2019) is: 3.9%  --------------------------------------------------------------------------------------------------- Anxiety, Follow-up  She was last seen for anxiety 6 months ago. Changes made at last visit include no changes. May consider going up on zoloft.   She reports {excellent/good/fair/poor:19665} compliance with treatment. She reports {good/fair/poor:18685} tolerance of treatment. She {is/is not:21021397} having side effects. {document side effects if present:1}  She feels her anxiety is {Desc; severity:60313} and {improved/worse/unchanged:3041574} since last visit.  Symptoms: {Yes/No:20286} chest pain {Yes/No:20286} difficulty concentrating  {Yes/No:20286} dizziness {Yes/No:20286} fatigue  {Yes/No:20286} feelings of losing control {Yes/No:20286} insomnia  {Yes/No:20286} irritable {Yes/No:20286} palpitations  {Yes/No:20286} panic attacks {Yes/No:20286} racing thoughts  {Yes/No:20286} shortness of breath {Yes/No:20286} sweating  {Yes/No:20286} tremors/shakes    GAD-7 Results     No data to display          PHQ-9 Scores    09/05/2021    1:58 PM 08/12/2020    4:05 PM 03/12/2019    1:41 PM  PHQ9 SCORE ONLY  PHQ-9 Total Score 2 1 0   --------------------------------------------------------------------------------------------------- .pcp  Medications: Outpatient Medications Prior to Visit  Medication Sig   atorvastatin (LIPITOR) 10 MG tablet TAKE 1 TABLET BY MOUTH EVERY DAY   Azelaic Acid (FINACEA) 15 % FOAM Apply to face once to twice a day for rosacea.   Cholecalciferol 1000 units capsule Take by mouth.   famotidine (PEPCID) 20 MG tablet Take 20 mg by mouth 2 (two) times daily.   FLAXSEED, LINSEED, PO Take by mouth.   Ibuprofen 200 MG CAPS Take by mouth.   mometasone (ELOCON) 0.1 %  cream Apply 1 application topically daily as needed (Rash). Avoid applying to face, groin, and axilla. Use as  directed. Risk of skin atrophy with long-term use reviewed.   Multiple Vitamin tablet Take by mouth.   sertraline (ZOLOFT) 50 MG tablet TAKE 1 TABLET BY MOUTH EVERY DAY   traZODone (DESYREL) 50 MG tablet TAKE 1 TABLET BY MOUTH EVERYDAY AT BEDTIME   triamterene-hydrochlorothiazide (DYAZIDE) 37.5-25 MG capsule TAKE 1 CAPSULE BY MOUTH EVERY DAY   No facility-administered medications prior to visit.    Review of Systems  {Labs  Heme  Chem  Endocrine  Serology  Results Review (optional):23779}   Objective    There were no vitals taken for this visit. {Show previous vital signs (optional):23777}  Physical Exam  ***  No results found for any visits on 03/09/22.  Assessment & Plan     ***  No follow-ups on file.      {provider attestation***:1}   Lavon Paganini, MD  [facility] [phone] (phone) [phone] (fax)  [place]

## 2022-03-09 ENCOUNTER — Ambulatory Visit: Payer: BC Managed Care – PPO | Admitting: Family Medicine

## 2022-03-09 ENCOUNTER — Encounter: Payer: Self-pay | Admitting: Family Medicine

## 2022-03-09 VITALS — BP 107/73 | HR 88 | Temp 98.1°F | Wt 185.0 lb

## 2022-03-09 DIAGNOSIS — F419 Anxiety disorder, unspecified: Secondary | ICD-10-CM | POA: Diagnosis not present

## 2022-03-09 DIAGNOSIS — R7303 Prediabetes: Secondary | ICD-10-CM | POA: Diagnosis not present

## 2022-03-09 DIAGNOSIS — E6609 Other obesity due to excess calories: Secondary | ICD-10-CM | POA: Diagnosis not present

## 2022-03-09 DIAGNOSIS — Z23 Encounter for immunization: Secondary | ICD-10-CM

## 2022-03-09 DIAGNOSIS — I10 Essential (primary) hypertension: Secondary | ICD-10-CM

## 2022-03-09 DIAGNOSIS — Z6831 Body mass index (BMI) 31.0-31.9, adult: Secondary | ICD-10-CM

## 2022-03-09 NOTE — Assessment & Plan Note (Signed)
Chronic and well controlled Continue zoloft at current dose 

## 2022-03-09 NOTE — Assessment & Plan Note (Signed)
Well controlled Continue current medications Recheck metabolic panel F/u in 6 months  

## 2022-03-09 NOTE — Assessment & Plan Note (Signed)
Discussed importance of healthy weight management Discussed diet and exercise  

## 2022-03-09 NOTE — Assessment & Plan Note (Signed)
Recommend low carb diet °Recheck A1c  °

## 2022-03-11 LAB — BASIC METABOLIC PANEL
BUN/Creatinine Ratio: 17 (ref 12–28)
BUN: 17 mg/dL (ref 8–27)
CO2: 28 mmol/L (ref 20–29)
Calcium: 9.5 mg/dL (ref 8.7–10.3)
Chloride: 102 mmol/L (ref 96–106)
Creatinine, Ser: 1.03 mg/dL — ABNORMAL HIGH (ref 0.57–1.00)
Glucose: 106 mg/dL — ABNORMAL HIGH (ref 70–99)
Potassium: 3.6 mmol/L (ref 3.5–5.2)
Sodium: 144 mmol/L (ref 134–144)
eGFR: 62 mL/min/{1.73_m2} (ref >59)

## 2022-03-11 LAB — HEMOGLOBIN A1C
Est. average glucose Bld gHb Est-mCnc: 126 mg/dL
Hgb A1c MFr Bld: 6 % — ABNORMAL HIGH (ref 4.8–5.6)

## 2022-04-20 ENCOUNTER — Other Ambulatory Visit: Payer: Self-pay | Admitting: Family Medicine

## 2022-04-20 DIAGNOSIS — I10 Essential (primary) hypertension: Secondary | ICD-10-CM

## 2022-04-20 NOTE — Telephone Encounter (Signed)
Requested Prescriptions  Pending Prescriptions Disp Refills   triamterene-hydrochlorothiazide (DYAZIDE) 37.5-25 MG capsule [Pharmacy Med Name: TRIAMTERENE-HCTZ 37.5-25 MG CP] 90 capsule 1    Sig: TAKE 1 CAPSULE BY MOUTH EVERY DAY     Cardiovascular: Diuretic Combos Failed - 04/20/2022  2:38 AM      Failed - Cr in normal range and within 180 days    Creatinine, Ser  Date Value Ref Range Status  03/10/2022 1.03 (H) 0.57 - 1.00 mg/dL Final         Passed - K in normal range and within 180 days    Potassium  Date Value Ref Range Status  03/10/2022 3.6 3.5 - 5.2 mmol/L Final         Passed - Na in normal range and within 180 days    Sodium  Date Value Ref Range Status  03/10/2022 144 134 - 144 mmol/L Final         Passed - Last BP in normal range    BP Readings from Last 1 Encounters:  03/09/22 107/73         Passed - Valid encounter within last 6 months    Recent Outpatient Visits           1 month ago [place], Dionne Bucy, MD   7 months ago Encounter for annual physical exam   [facility] [place], Dionne Bucy, MD   10 months ago Acute non-recurrent sinusitis, unspecified location   [facility] [place], Jake Church, DO   1 year ago Annual physical exam   [facility], Clearnce Sorrel, [place]   2 years ago Annual physical exam   [facility] [place], Clearnce Sorrel, [place]       Future Appointments             In 4 months Bacigalupo, Dionne Bucy, MD [facility], [place]

## 2022-04-24 ENCOUNTER — Other Ambulatory Visit: Payer: Self-pay | Admitting: Family Medicine

## 2022-04-24 DIAGNOSIS — Z1231 Encounter for screening mammogram for malignant neoplasm of breast: Secondary | ICD-10-CM

## 2022-05-11 IMAGING — MG MM DIGITAL SCREENING BILAT W/ TOMO AND CAD
8 series · 8 of 24 positions shown · non-contrast
Comparison: Previous exam(s).

CLINICAL DATA: Screening.

EXAM:
DIGITAL SCREENING BILATERAL MAMMOGRAM WITH TOMOSYNTHESIS AND CAD
TECHNIQUE: Bilateral screening digital craniocaudal and mediolateral oblique
mammograms were obtained. Bilateral screening digital breast
tomosynthesis was performed. The images were evaluated with
computer-aided detection.

[L MLO synth-2D]
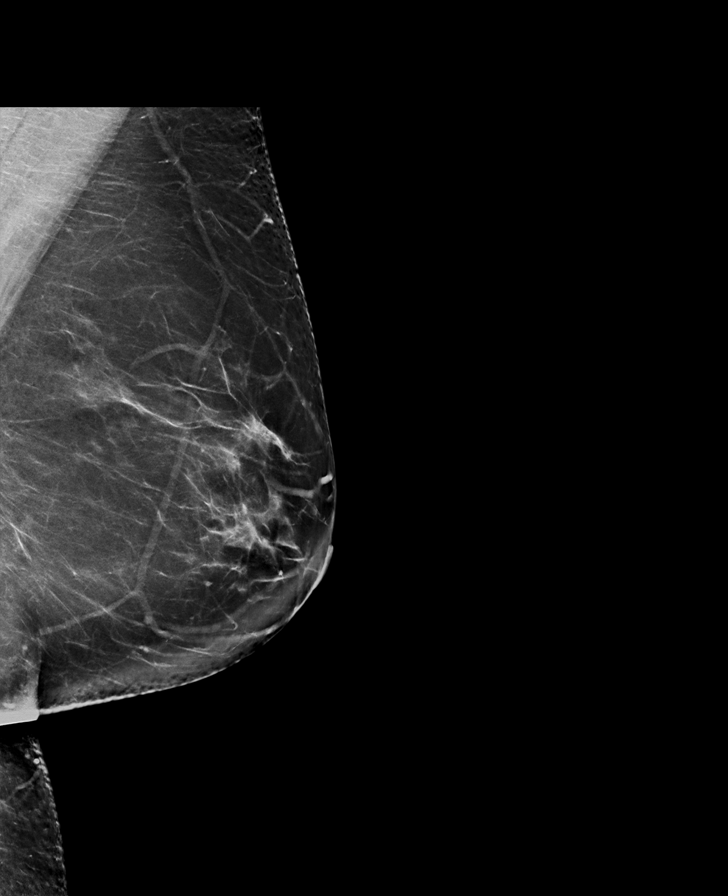

[R MLO synth-2D]
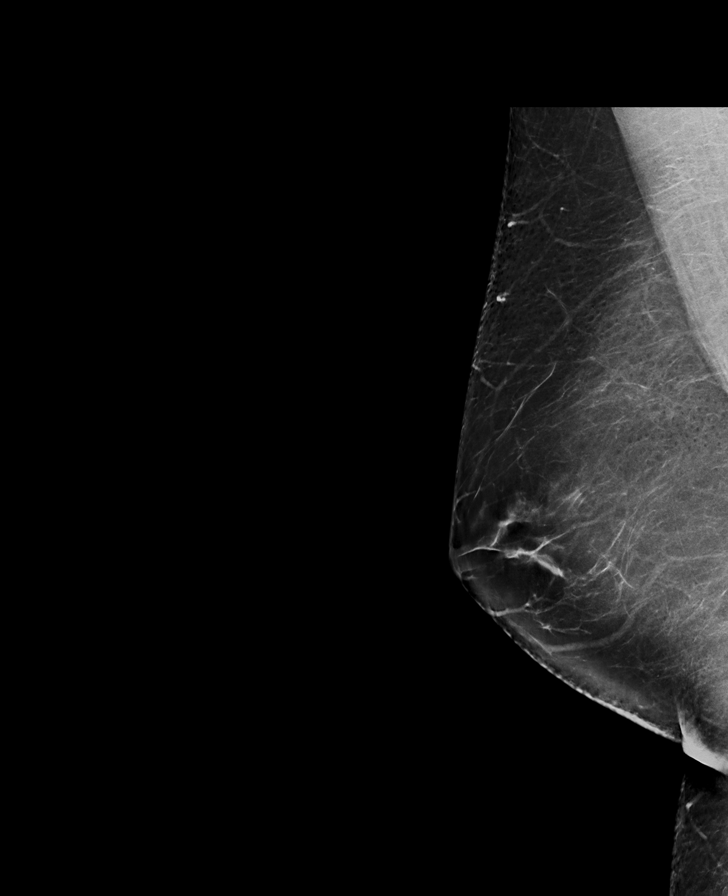

[R CC synth-2D]
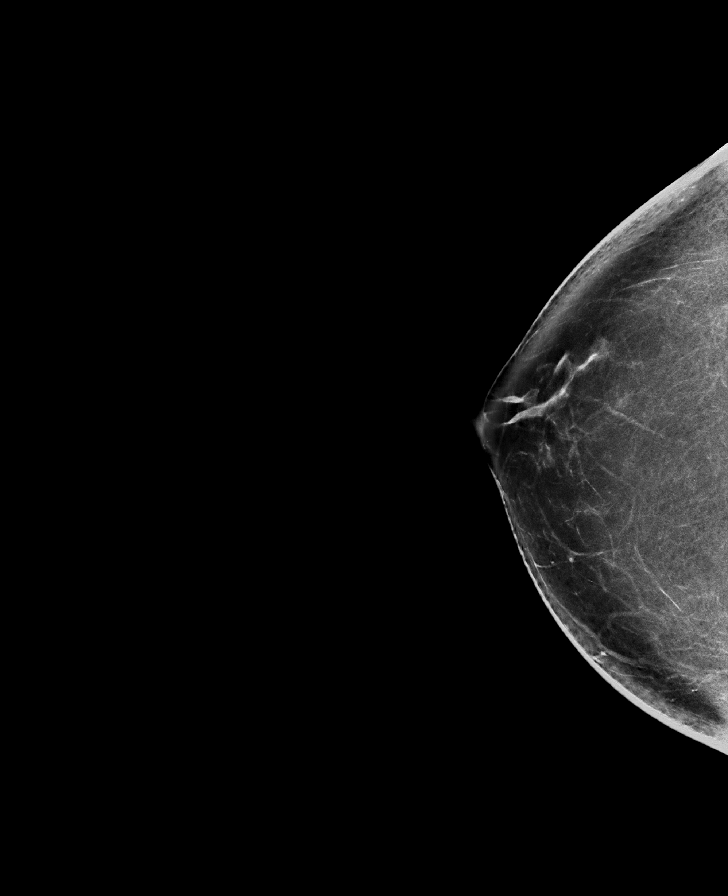

[L CC synth-2D]
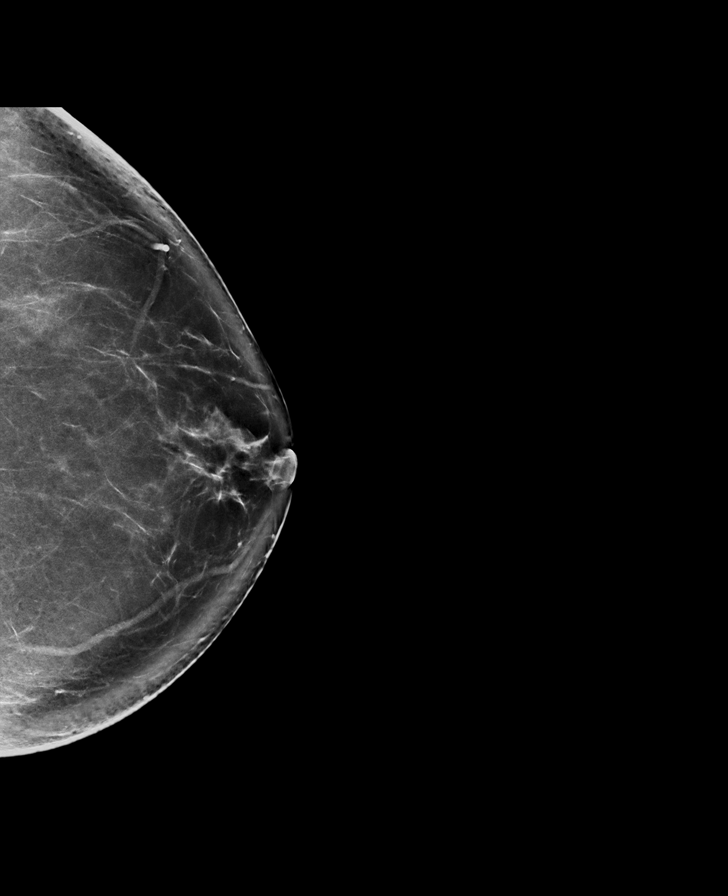

[R CC tomo · tomo slice 43/85.0]
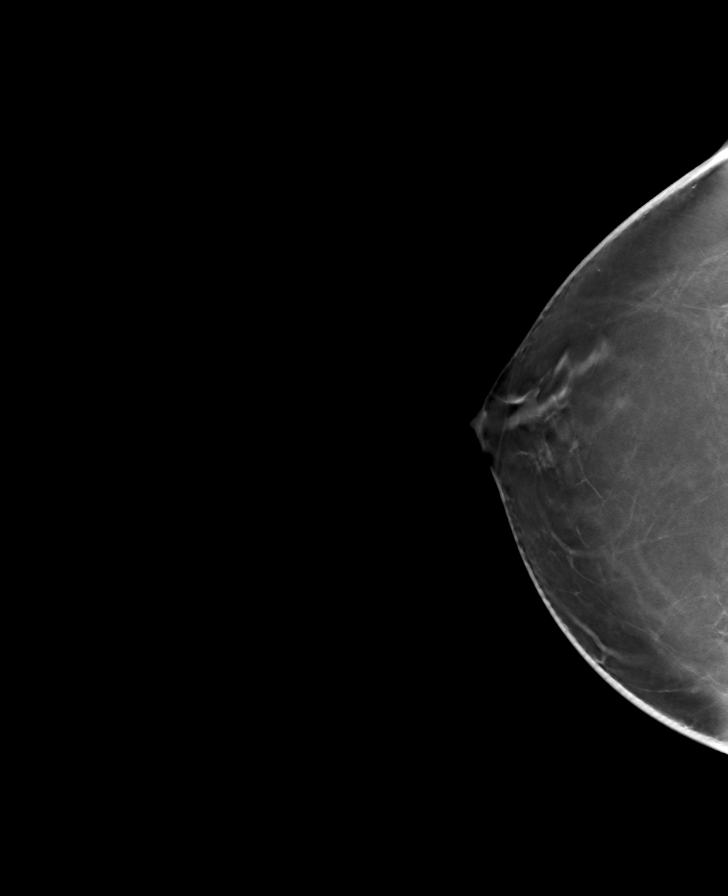

[R MLO tomo · tomo slice 47/94.0]
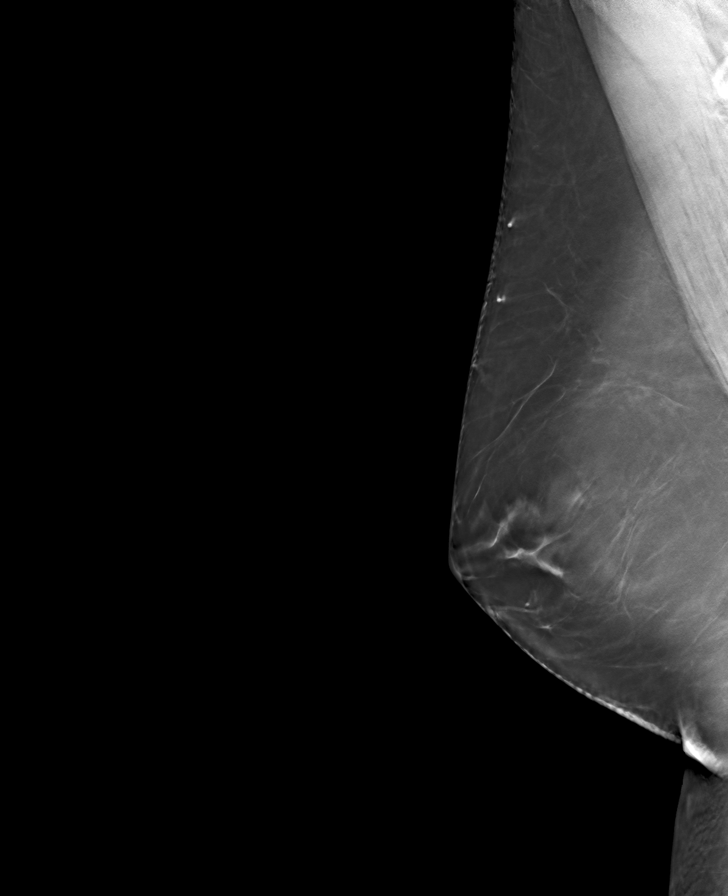

[L CC tomo · tomo slice 47/93.0]
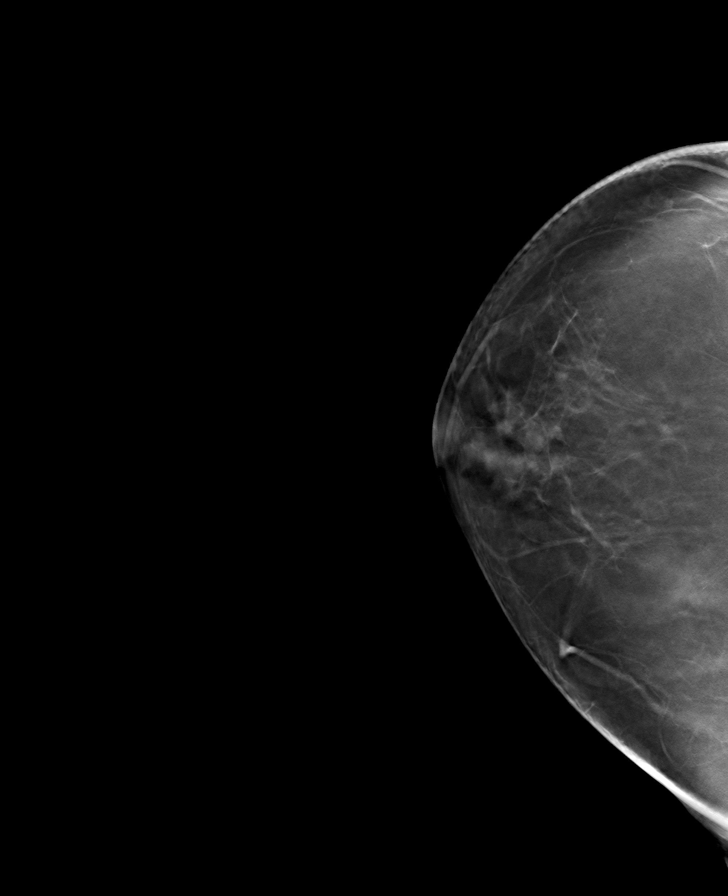

[L MLO tomo · tomo slice 47/93.0]
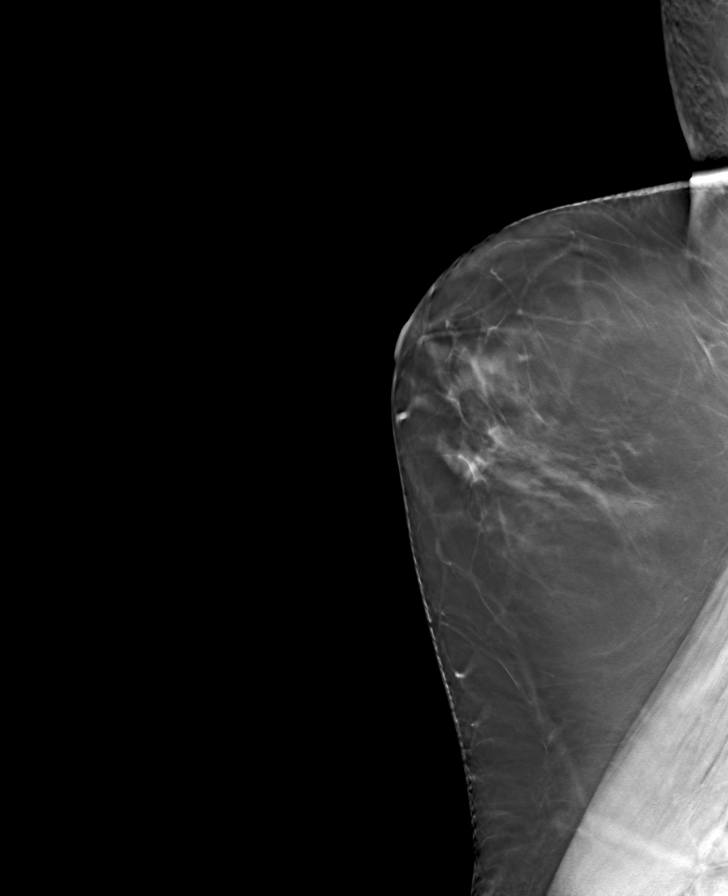

[8 of 24 positions shown; findings below may reference images not displayed]

ACR Breast Density Category b: There are scattered areas of
fibroglandular density.
FINDINGS: There are no findings suspicious for malignancy.
IMPRESSION: No mammographic evidence of malignancy. A result letter of this
screening mammogram will be mailed directly to the patient.

RECOMMENDATION:
Screening mammogram in one year. (Code:51-O-LD2)

BI-RADS CATEGORY  1: Negative.

## 2022-05-28 ENCOUNTER — Other Ambulatory Visit: Payer: Self-pay | Admitting: Family Medicine

## 2022-05-28 DIAGNOSIS — F419 Anxiety disorder, unspecified: Secondary | ICD-10-CM

## 2022-06-02 ENCOUNTER — Ambulatory Visit
Admission: RE | Admit: 2022-06-02 | Discharge: 2022-06-02 | Disposition: A | Discharge status: Home / Self Care | Payer: BC Managed Care – PPO | Source: Ambulatory Visit | Attending: Family Medicine | Admitting: Family Medicine

## 2022-06-02 DIAGNOSIS — Z1231 Encounter for screening mammogram for malignant neoplasm of breast: Secondary | ICD-10-CM | POA: Diagnosis present

## 2022-06-18 ENCOUNTER — Other Ambulatory Visit: Payer: Self-pay | Admitting: Family Medicine

## 2022-06-18 DIAGNOSIS — E7800 Pure hypercholesterolemia, unspecified: Secondary | ICD-10-CM

## 2022-08-24 ENCOUNTER — Other Ambulatory Visit: Payer: Self-pay | Admitting: Family Medicine

## 2022-08-24 DIAGNOSIS — F419 Anxiety disorder, unspecified: Secondary | ICD-10-CM

## 2022-09-12 ENCOUNTER — Encounter: Payer: BC Managed Care – PPO | Admitting: Family Medicine

## 2022-09-16 ENCOUNTER — Other Ambulatory Visit: Payer: Self-pay | Admitting: Family Medicine

## 2022-09-16 DIAGNOSIS — I10 Essential (primary) hypertension: Secondary | ICD-10-CM

## 2022-09-22 ENCOUNTER — Encounter: Payer: BC Managed Care – PPO | Admitting: Family Medicine

## 2022-10-02 ENCOUNTER — Ambulatory Visit (INDEPENDENT_AMBULATORY_CARE_PROVIDER_SITE_OTHER): Payer: BC Managed Care – PPO | Admitting: Family Medicine

## 2022-10-02 ENCOUNTER — Encounter: Payer: Self-pay | Admitting: Family Medicine

## 2022-10-02 VITALS — BP 115/72 | HR 83 | Temp 97.9°F | Resp 12 | Ht 64.0 in | Wt 190.0 lb

## 2022-10-02 DIAGNOSIS — R7303 Prediabetes: Secondary | ICD-10-CM | POA: Diagnosis not present

## 2022-10-02 DIAGNOSIS — Z6832 Body mass index (BMI) 32.0-32.9, adult: Secondary | ICD-10-CM | POA: Diagnosis not present

## 2022-10-02 DIAGNOSIS — E7800 Pure hypercholesterolemia, unspecified: Secondary | ICD-10-CM | POA: Diagnosis not present

## 2022-10-02 DIAGNOSIS — M222X2 Patellofemoral disorders, left knee: Secondary | ICD-10-CM

## 2022-10-02 DIAGNOSIS — I10 Essential (primary) hypertension: Secondary | ICD-10-CM | POA: Diagnosis not present

## 2022-10-02 DIAGNOSIS — Z0000 Encounter for general adult medical examination without abnormal findings: Secondary | ICD-10-CM | POA: Diagnosis not present

## 2022-10-02 DIAGNOSIS — E6609 Other obesity due to excess calories: Secondary | ICD-10-CM | POA: Diagnosis not present

## 2022-10-02 MED ORDER — NALTREXONE HCL 50 MG PO TABS
25.0000 mg | ORAL_TABLET | Freq: Every day | ORAL | 1 refills | Status: DC
Start: 2022-10-02 — End: 2023-01-04

## 2022-10-02 MED ORDER — MELOXICAM 15 MG PO TABS
15.0000 mg | ORAL_TABLET | Freq: Every day | ORAL | 0 refills | Status: DC
Start: 2022-10-02 — End: 2022-10-30

## 2022-10-02 MED ORDER — BUPROPION HCL ER (XL) 150 MG PO TB24
150.0000 mg | ORAL_TABLET | Freq: Every day | ORAL | 1 refills | Status: DC
Start: 2022-10-02 — End: 2023-01-04

## 2022-10-02 NOTE — Assessment & Plan Note (Signed)
Recommend low carb diet °Recheck A1c  °

## 2022-10-02 NOTE — Assessment & Plan Note (Signed)
Previously well controlled Continue statin Repeat FLP and CMP  

## 2022-10-02 NOTE — Assessment & Plan Note (Signed)
Discussed importance of healthy weight management Discussed diet and exercise  Discussed medication options for weight loss, incl GLP1s/GIP, phentermine, contrave, and qsymia Discussed benefits and possible side effects Discussed possible insurance coverage/cost Patient opts for generic Contrave today-Rx sent for Wellbutrin XL 150 mg daily and naltrexone 25 mg daily

## 2022-10-02 NOTE — Progress Notes (Signed)
I,Sulibeya S Dimas,acting as a Neurosurgeon for Shirlee Latch, MD.,have documented all relevant documentation on the behalf of Shirlee Latch, MD,as directed by  Shirlee Latch, MD while in the presence of Shirlee Latch, MD.    Complete physical exam   Patient: Rachel Ali   DOB: 08-24-1960   61 y.o. Female  MRN: [id number] Visit Date: 10/02/2022  Today's healthcare provider: Shirlee Latch, MD   Chief Complaint  Patient presents with   Annual Exam   Knee Pain   Subjective    Rachel Ali is a 62 y.o. female who presents today for a complete physical exam.  She reports consuming a general diet. Home exercise routine includes treadmill. She generally feels fairly well. She reports sleeping well. She does have additional problems to discuss today.   HPI  Left knee pain x 3 months. She reports pain with walking down > up the stairs. She denies any swelling, falls or injures. Tried brace, rest. No improvement. Starting to hurt more with everyday walking. Lateral knee. Feels like there is a catch, no instability.  Past Medical History:  Diagnosis Date   Allergy    sinus problem   Past Surgical History:  Procedure Laterality Date   COLONOSCOPY WITH PROPOFOL N/A 08/29/2019   Procedure: COLONOSCOPY WITH PROPOFOL;  Surgeon: Toney Reil, MD;  Location: [facility] ENDOSCOPY;  Service: Gastroenterology;  Laterality: N/A;   DILATION AND CURETTAGE OF UTERUS     ESSURE TUBAL LIGATION     OTHER SURGICAL HISTORY     coil placement in tubes to prevent pregnancy-patient could not recall the name of the procedure   TONSILLECTOMY     TUBAL LIGATION     Social History   Socioeconomic History   Marital status: Married    Spouse name: Verlon Au   Number of children: 1   Years of education: Associates   Highest education level: Not on file  Occupational History    Employer: [employer]  Tobacco Use   Smoking status: Former    Packs/day: 0.50    Years: 3.00    Additional pack years:  0.00    Total pack years: 1.50    Types: Cigarettes    Quit date: 05/22/1993    Years since quitting: 29.3   Smokeless tobacco: Never  Vaping Use   Vaping Use: Never used  Substance and Sexual Activity   Alcohol use: No    Comment: rare   Drug use: No   Sexual activity: Yes    Birth control/protection: Post-menopausal  Other Topics Concern   Not on file  Social History Narrative   Not on file   Social Determinants of Health   Financial Resource Strain: Not on file  Food Insecurity: Not on file  Transportation Needs: Not on file  Physical Activity: Not on file  Stress: Not on file  Social Connections: Not on file  Intimate Partner Violence: Not on file   Family Status  Relation Name Status   Mother  Alive   Father  Deceased at age 62   Brother  Alive   MGM  Deceased at age 33   MGF  Deceased   PGM  Deceased at age 36   PGF  Deceased   Neg Hx  (Not Specified)   Family History  Problem Relation Age of Onset   Hypertension Mother    Uterine cancer Mother    Heart disease Father    Hypertension Father    Cancer Father  Hypertension Brother    Congestive Heart Failure Maternal Grandmother    Heart disease Maternal Grandfather    Congestive Heart Failure Paternal Grandmother    Stroke Paternal Grandfather    Breast cancer Neg Hx    Allergies  Allergen Reactions   Cinnamon     mild Trouble breathing.   Latex    Penicillins     Itching    Patient Care Team: Erasmo Downer, MD as PCP - General (Family Medicine)   Medications: Outpatient Medications Prior to Visit  Medication Sig   atorvastatin (LIPITOR) 10 MG tablet TAKE 1 TABLET BY MOUTH EVERY DAY   Azelaic Acid (FINACEA) 15 % FOAM Apply to face once to twice a day for rosacea.   Cholecalciferol 1000 units capsule Take by mouth.   famotidine (PEPCID) 20 MG tablet Take 20 mg by mouth 2 (two) times daily.   FLAXSEED, LINSEED, PO Take by mouth.   mometasone (ELOCON) 0.1 % cream Apply 1 application  topically daily as needed (Rash). Avoid applying to face, groin, and axilla. Use as directed. Risk of skin atrophy with long-term use reviewed.   Multiple Vitamin tablet Take by mouth.   sertraline (ZOLOFT) 50 MG tablet TAKE 1 TABLET BY MOUTH EVERY DAY   traZODone (DESYREL) 50 MG tablet TAKE 1 TABLET BY MOUTH EVERYDAY AT BEDTIME   triamterene-hydrochlorothiazide (DYAZIDE) 37.5-25 MG capsule TAKE 1 CAPSULE BY MOUTH EVERY DAY   [DISCONTINUED] Ibuprofen 200 MG CAPS Take by mouth.   No facility-administered medications prior to visit.    Review of Systems  HENT:  Positive for sinus pressure.   Eyes:  Positive for itching.  Musculoskeletal:  Positive for arthralgias.  Allergic/Immunologic: Positive for environmental allergies.  All other systems reviewed and are negative.   Last CBC Lab Results  Component Value Date   WBC 4.6 03/26/2020   HGB 13.3 03/26/2020   HCT 38.6 03/26/2020   MCV 93 03/26/2020   MCH 31.9 03/26/2020   RDW 12.0 03/26/2020   PLT 248 03/26/2020   Last metabolic panel Lab Results  Component Value Date   GLUCOSE 106 (H) 03/10/2022   NA 144 03/10/2022   K 3.6 03/10/2022   CL 102 03/10/2022   CO2 28 03/10/2022   BUN 17 03/10/2022   CREATININE 1.03 (H) 03/10/2022   EGFR 62 03/10/2022   CALCIUM 9.5 03/10/2022   PROT 7.0 09/06/2021   ALBUMIN 4.7 09/06/2021   LABGLOB 2.3 09/06/2021   AGRATIO 2.0 09/06/2021   BILITOT 0.3 09/06/2021   ALKPHOS 146 (H) 09/06/2021   AST 29 09/06/2021   ALT 35 (H) 09/06/2021   Last lipids Lab Results  Component Value Date   CHOL 163 09/06/2021   HDL 55 09/06/2021   LDLCALC 79 09/06/2021   TRIG 173 (H) 09/06/2021   CHOLHDL 3.0 09/06/2021   Last hemoglobin A1c Lab Results  Component Value Date   HGBA1C 6.0 (H) 03/10/2022   Last thyroid functions Lab Results  Component Value Date   TSH 1.490 03/26/2020   Last vitamin D Lab Results  Component Value Date   VD25OH 46.7 09/06/2021   Last vitamin B12 and Folate Lab  Results  Component Value Date   VITAMINB12 633 03/26/2020   FOLATE >20.0 03/26/2020      Objective    BP 115/72 (BP Location: Left Arm, Patient Position: Sitting, Cuff Size: Large)   Pulse 83   Temp 97.9 F (36.6 C) (Temporal)   Resp 12   Ht 5\' 4"  (1.626 m)  Wt 190 lb (86.2 kg)   BMI 32.61 kg/m  BP Readings from Last 3 Encounters:  10/02/22 115/72  03/09/22 107/73  09/05/21 130/80   Wt Readings from Last 3 Encounters:  10/02/22 190 lb (86.2 kg)  03/09/22 185 lb (83.9 kg)  09/05/21 185 lb 14.4 oz (84.3 kg)      Physical Exam Vitals reviewed.  Constitutional:      General: She is not in acute distress.    Appearance: Normal appearance. She is well-developed. She is not diaphoretic.  HENT:     Head: Normocephalic and atraumatic.     Right Ear: Tympanic membrane, ear canal and external ear normal.     Left Ear: Tympanic membrane, ear canal and external ear normal.     Nose: Nose normal.     Mouth/Throat:     Mouth: Mucous membranes are moist.     Pharynx: Oropharynx is clear. No oropharyngeal exudate.  Eyes:     General: No scleral icterus.    Conjunctiva/sclera: Conjunctivae normal.     Pupils: Pupils are equal, round, and reactive to light.  Neck:     Thyroid: No thyromegaly.  Cardiovascular:     Rate and Rhythm: Normal rate and regular rhythm.     Pulses: Normal pulses.     Heart sounds: Normal heart sounds. No murmur heard. Pulmonary:     Effort: Pulmonary effort is normal. No respiratory distress.     Breath sounds: Normal breath sounds. No wheezing or rales.  Abdominal:     General: There is no distension.     Palpations: Abdomen is soft.     Tenderness: There is no abdominal tenderness.  Musculoskeletal:        General: No deformity.     Cervical back: Neck supple.     Right lower leg: No edema.     Left lower leg: No edema.     Comments: L Knee: Normal to inspection with no erythema or effusion or obvious bony abnormalities.  Palpation normal  with no warmth or joint line tenderness or patellar tenderness or condyle tenderness. ROM normal in flexion and extension and lower leg rotation. Ligaments with solid consistent endpoints including ACL, PCL, LCL, MCL. Negative Thessaly's and provocative meniscal tests. Non painful patellar compression. Patellar and quadriceps tendons unremarkable. Hamstring and quadriceps strength is normal.   Lymphadenopathy:     Cervical: No cervical adenopathy.  Skin:    General: Skin is warm and dry.     Findings: No rash.  Neurological:     Mental Status: She is alert and oriented to person, place, and time. Mental status is at baseline.     Gait: Gait normal.  Psychiatric:        Mood and Affect: Mood normal.        Behavior: Behavior normal.        Thought Content: Thought content normal.       Last depression screening scores    10/02/2022    4:11 PM 03/09/2022   11:22 AM 09/05/2021    1:58 PM  PHQ 2/9 Scores  PHQ - 2 Score 0 0 0  PHQ- 9 Score 1 0 2   Last fall risk screening    10/02/2022    4:11 PM  Fall Risk   Falls in the past year? 0  Number falls in past yr: 0  Injury with Fall? 0  Risk for fall due to : No Fall Risks  Follow up Falls evaluation completed  Last Audit-C alcohol use screening    10/02/2022    4:11 PM  Alcohol Use Disorder Test (AUDIT)  1. How often do you have a drink containing alcohol? 1  2. How many drinks containing alcohol do you have on a typical day when you are drinking? 0  3. How often do you have six or more drinks on one occasion? 0  AUDIT-C Score 1   A score of 3 or more in women, and 4 or more in men indicates increased risk for alcohol abuse, EXCEPT if all of the points are from question 1   No results found for any visits on 10/02/22.  Assessment & Plan    Routine Health Maintenance and Physical Exam  Exercise Activities and Dietary recommendations  Goals   None     Immunization History  Administered Date(s) Administered    Influenza,inj,Quad PF,6+ Mos 02/22/2013, 03/13/2015, 03/18/2016, 02/24/2017, 03/20/2018, 03/12/2019, 03/26/2020, 03/09/2022   Moderna Covid-19 Vaccine Bivalent Booster 26yrs & up 05/11/2020, 02/11/2021   Moderna Sars-Covid-2 Vaccination 08/18/2019, 09/15/2019   Tdap 03/26/2020   Zoster Recombinat (Shingrix) 09/05/2021, 03/09/2022    Health Maintenance  Topic Date Due   COVID-19 Vaccine (5 - 2023-24 season) 01/20/2022   INFLUENZA VACCINE  12/21/2022   MAMMOGRAM  06/03/2023   COLONOSCOPY (Pts 45-63yrs Insurance coverage will need to be confirmed)  08/28/2024   PAP SMEAR-Modifier  03/26/2025   DTaP/Tdap/Td (2 - Td or Tdap) 03/26/2030   Hepatitis C Screening  Completed   HIV Screening  Completed   Zoster Vaccines- Shingrix  Completed   HPV VACCINES  Aged Out    Discussed health benefits of physical activity, and encouraged her to engage in regular exercise appropriate for her age and condition.  Problem List Items Addressed This Visit       Cardiovascular and Mediastinum   Hypertension    Well controlled Continue current medications Recheck metabolic panel F/u in 6 months       Relevant Orders   Comprehensive metabolic panel     Musculoskeletal and Integument   Patellofemoral disorder of left knee    Benign left knee exam Seems most likely consistent with patellofemoral disorder Gave patient option of home exercise program or PT-declines PT referral, so home exercise program given today Meloxicam for at least 1 month RICE        Other   Prediabetes    Recommend low carb diet Recheck A1c       Relevant Orders   Hemoglobin A1c   Obese    Discussed importance of healthy weight management Discussed diet and exercise  Discussed medication options for weight loss, incl GLP1s/GIP, phentermine, contrave, and qsymia Discussed benefits and possible side effects Discussed possible insurance coverage/cost Patient opts for generic Contrave today-Rx sent for Wellbutrin XL  150 mg daily and naltrexone 25 mg daily      Relevant Orders   Comprehensive metabolic panel   Lipid panel   Hemoglobin A1c   Hypercholesterolemia    Previously well controlled Continue statin Repeat FLP and CMP       Relevant Orders   Comprehensive metabolic panel   Lipid panel   Other Visit Diagnoses     Encounter for annual physical exam    -  Primary   Relevant Orders   Comprehensive metabolic panel   Lipid panel   Hemoglobin A1c        Return in about 3 months (around 01/02/2023) for weight f/u.     I, Shirlee Latch,  MD, have reviewed all documentation for this visit. The documentation on 10/02/22 for the exam, diagnosis, procedures, and orders are all accurate and complete.   Lukas Pelcher, Marzella Schlein, MD, MPH [facility]

## 2022-10-02 NOTE — Assessment & Plan Note (Signed)
Benign left knee exam Seems most likely consistent with patellofemoral disorder Gave patient option of home exercise program or PT-declines PT referral, so home exercise program given today Meloxicam for at least 1 month RICE

## 2022-10-02 NOTE — Assessment & Plan Note (Signed)
Well controlled Continue current medications Recheck metabolic panel F/u in 6 months  

## 2022-10-04 LAB — COMPREHENSIVE METABOLIC PANEL
ALT: 27 IU/L (ref 0–32)
AST: 25 IU/L (ref 0–40)
Albumin/Globulin Ratio: 2.3 — ABNORMAL HIGH (ref 1.2–2.2)
Albumin: 4.6 g/dL (ref 3.8–4.9)
Alkaline Phosphatase: 146 IU/L — ABNORMAL HIGH (ref 44–121)
BUN/Creatinine Ratio: 13 (ref 12–28)
BUN: 13 mg/dL (ref 8–27)
Bilirubin Total: 0.3 mg/dL (ref 0.0–1.2)
CO2: 27 mmol/L (ref 20–29)
Calcium: 9.4 mg/dL (ref 8.7–10.3)
Chloride: 102 mmol/L (ref 96–106)
Creatinine, Ser: 0.98 mg/dL (ref 0.57–1.00)
Globulin, Total: 2 g/dL (ref 1.5–4.5)
Glucose: 104 mg/dL — ABNORMAL HIGH (ref 70–99)
Potassium: 4 mmol/L (ref 3.5–5.2)
Sodium: 142 mmol/L (ref 134–144)
Total Protein: 6.6 g/dL (ref 6.0–8.5)
eGFR: 66 mL/min/{1.73_m2} (ref >59)

## 2022-10-04 LAB — LIPID PANEL
Chol/HDL Ratio: 3.3 ratio (ref 0.0–4.4)
Cholesterol, Total: 144 mg/dL (ref 100–199)
HDL: 44 mg/dL (ref >39)
LDL Chol Calc (NIH): 64 mg/dL (ref 0–99)
Triglycerides: 220 mg/dL — ABNORMAL HIGH (ref 0–149)
VLDL Cholesterol Cal: 36 mg/dL (ref 5–40)

## 2022-10-04 LAB — HEMOGLOBIN A1C
Est. average glucose Bld gHb Est-mCnc: 128 mg/dL
Hgb A1c MFr Bld: 6.1 % — ABNORMAL HIGH (ref 4.8–5.6)

## 2022-10-29 ENCOUNTER — Other Ambulatory Visit: Payer: Self-pay | Admitting: Family Medicine

## 2022-11-19 ENCOUNTER — Other Ambulatory Visit: Payer: Self-pay | Admitting: Family Medicine

## 2022-11-19 DIAGNOSIS — F419 Anxiety disorder, unspecified: Secondary | ICD-10-CM

## 2022-12-03 ENCOUNTER — Other Ambulatory Visit: Payer: Self-pay | Admitting: Family Medicine

## 2022-12-13 ENCOUNTER — Other Ambulatory Visit: Payer: Self-pay | Admitting: Family Medicine

## 2022-12-13 DIAGNOSIS — E7800 Pure hypercholesterolemia, unspecified: Secondary | ICD-10-CM

## 2023-01-02 ENCOUNTER — Other Ambulatory Visit: Payer: Self-pay | Admitting: Family Medicine

## 2023-01-04 ENCOUNTER — Encounter: Payer: Self-pay | Admitting: Family Medicine

## 2023-01-04 ENCOUNTER — Ambulatory Visit: Payer: BC Managed Care – PPO | Admitting: Family Medicine

## 2023-01-04 VITALS — BP 123/71 | HR 74 | Temp 98.3°F | Resp 12 | Ht 64.0 in | Wt 192.7 lb

## 2023-01-04 DIAGNOSIS — E6609 Other obesity due to excess calories: Secondary | ICD-10-CM

## 2023-01-04 DIAGNOSIS — Z6833 Body mass index (BMI) 33.0-33.9, adult: Secondary | ICD-10-CM

## 2023-01-04 DIAGNOSIS — I10 Essential (primary) hypertension: Secondary | ICD-10-CM | POA: Diagnosis not present

## 2023-01-04 MED ORDER — PHENTERMINE HCL 37.5 MG PO CAPS: 37.5000 mg | ORAL_CAPSULE | ORAL | 2 refills | Status: DC

## 2023-01-04 NOTE — Assessment & Plan Note (Signed)
Unsuccessful trial of bupropion and naltrexone due to intolerable side effects and minimal weight loss. Discussed alternative options including a medical weight loss clinic, phentermine, and Qsymia. Patient declined Qsymia due to potential for cognitive side effects. -Discontinue bupropion and naltrexone. -Start phentermine for a 8-month trial to aid in weight loss. -Check in 6 months or sooner if issues arise.

## 2023-01-04 NOTE — Progress Notes (Signed)
Established Patient Office Visit  Subjective   Patient ID: Rachel Ali, female    DOB: 01-Sep-1960  Age: 62 y.o. MRN: [id number]  Chief Complaint  Patient presents with   Medical Management of Chronic Issues    HPI  Discussed the use of AI scribe software for clinical note transcription with the patient, who gave verbal consent to proceed.  History of Present Illness   The patient, with a history of obesity, presents for a follow-up visit after a trial of bupropion and naltrexone for weight loss. They report significant lethargy and a feeling of being 'drunk' on the medication, leading to a lack of desire to do anything. Despite trying the medication for a month and a half, they only lost two pounds and decided to discontinue the medication due to the side effects and lack of benefit.  In addition, the patient was previously prescribed medication for knee pain, which they report helped significantly. They were able to ascend and descend stairs without much pain and decided to stop taking the medication (Mobic) once the pain was manageable. They plan to keep the medication on hand in case the knee pain returns.         ROS per HPI    Objective:     BP 123/71 (BP Location: Right Arm, Patient Position: Sitting, Cuff Size: Large)   Pulse 74   Temp 98.3 F (36.8 C) (Temporal)   Resp 12   Ht 5\' 4"  (1.626 m)   Wt 192 lb 11.2 oz (87.4 kg)   SpO2 96%   BMI 33.08 kg/m    Physical Exam Vitals reviewed.  Constitutional:      General: She is not in acute distress.    Appearance: Normal appearance. She is well-developed.  HENT:     Head: Normocephalic and atraumatic.  Eyes:     General: No scleral icterus.    Conjunctiva/sclera: Conjunctivae normal.  Cardiovascular:     Rate and Rhythm: Normal rate and regular rhythm.  Pulmonary:     Effort: Pulmonary effort is normal. No respiratory distress.  Skin:    General: Skin is warm and dry.     Findings: No rash.  Neurological:      Mental Status: She is alert and oriented to person, place, and time. Mental status is at baseline.  Psychiatric:        Behavior: Behavior normal.      No results found for any visits on 01/04/23.    The 10-year ASCVD risk score (Arnett DK, et al., 2019) is: 4.1%    Assessment & Plan:   Problem List Items Addressed This Visit       Cardiovascular and Mediastinum   Hypertension     Other   Obese - Primary    Unsuccessful trial of bupropion and naltrexone due to intolerable side effects and minimal weight loss. Discussed alternative options including a medical weight loss clinic, phentermine, and Qsymia. Patient declined Qsymia due to potential for cognitive side effects. -Discontinue bupropion and naltrexone. -Start phentermine for a 36-month trial to aid in weight loss. -Check in 6 months or sooner if issues arise.      Relevant Medications   phentermine 37.5 MG capsule        Knee Pain Resolved with medication (Mobic). Patient has discontinued medication as pain has improved. -Advise patient to keep medication on hand in case of recurrence of knee pain.  Colon Cancer Screening Last colonoscopy in 2021 with recommendation for repeat in  5 years (2026). -No action needed at this time.  General Health Maintenance / Followup Plans -Schedule annual physical examination. -Consider visiting a medical weight loss clinic if phentermine trial is unsuccessful.        Return in about 9 months (around 10/04/2023).    Shirlee Latch, MD

## 2023-02-02 ENCOUNTER — Other Ambulatory Visit: Payer: Self-pay | Admitting: Family Medicine

## 2023-02-12 ENCOUNTER — Encounter: Payer: BC Managed Care – PPO | Admitting: Dermatology

## 2023-03-03 ENCOUNTER — Other Ambulatory Visit: Payer: Self-pay | Admitting: Family Medicine

## 2023-03-05 NOTE — Telephone Encounter (Signed)
Requested medications are due for refill today.  yes  Requested medications are on the active medications list.  yes  Last refill. 02/02/2023 #30 0 rf  Future visit scheduled.   yes  Notes to clinic.  Expired labs.    Requested Prescriptions  Pending Prescriptions Disp Refills   meloxicam (MOBIC) 15 MG tablet [Pharmacy Med Name: MELOXICAM 15 MG TABLET] 30 tablet 0    Sig: TAKE 1 TABLET (15 MG TOTAL) BY MOUTH DAILY.     Analgesics:  COX2 Inhibitors Failed - 03/03/2023  8:23 AM      Failed - Manual Review: Labs are only required if the patient has taken medication for more than 8 weeks.      Failed - HGB in normal range and within 360 days    Hemoglobin  Date Value Ref Range Status  03/26/2020 13.3 11.1 - 15.9 g/dL Final         Failed - HCT in normal range and within 360 days    Hematocrit  Date Value Ref Range Status  03/26/2020 38.6 34.0 - 46.6 % Final         Passed - Cr in normal range and within 360 days    Creatinine, Ser  Date Value Ref Range Status  10/03/2022 0.98 0.57 - 1.00 mg/dL Final         Passed - AST in normal range and within 360 days    AST  Date Value Ref Range Status  10/03/2022 25 0 - 40 IU/L Final         Passed - ALT in normal range and within 360 days    ALT  Date Value Ref Range Status  10/03/2022 27 0 - 32 IU/L Final         Passed - eGFR is 30 or above and within 360 days    GFR calc Af Amer  Date Value Ref Range Status  03/26/2020 93 >59 mL/min/1.73 Final    Comment:    **In accordance with recommendations from the NKF-ASN Task force,**   [facility] is in the process of updating its eGFR calculation to the   2021 CKD-EPI creatinine equation that estimates kidney function   without a race variable.    GFR calc non Af Amer  Date Value Ref Range Status  03/26/2020 80 >59 mL/min/1.73 Final   eGFR  Date Value Ref Range Status  10/03/2022 66 >59 mL/min/1.73 Final         Passed - Patient is not pregnant      Passed - Valid  encounter within last 12 months    Recent Outpatient Visits           2 months ago Class 1 obesity due to excess calories with serious comorbidity and body mass index (BMI) of 33.0 to 33.9 in adult   [facility] [place], Marzella Schlein, MD   5 months ago Encounter for annual physical exam   [facility] [place], Marzella Schlein, MD   12 months ago Anxiety   [place] [facility] [place], Marzella Schlein, MD   1 year ago Encounter for annual physical exam   [facility] [place], Marzella Schlein, MD   1 year ago Acute non-recurrent sinusitis, unspecified location   [facility] [place], Darl Householder, DO       Future Appointments             In 7 months Beryle Flock, Marylene Land  Judie Petit, MD [facility]

## 2023-03-11 ENCOUNTER — Other Ambulatory Visit: Payer: Self-pay | Admitting: Family Medicine

## 2023-03-11 DIAGNOSIS — I10 Essential (primary) hypertension: Secondary | ICD-10-CM

## 2023-03-13 ENCOUNTER — Other Ambulatory Visit: Payer: Self-pay | Admitting: Family Medicine

## 2023-03-13 DIAGNOSIS — F419 Anxiety disorder, unspecified: Secondary | ICD-10-CM

## 2023-03-14 NOTE — Telephone Encounter (Signed)
Requested Prescriptions  Pending Prescriptions Disp Refills   sertraline (ZOLOFT) 50 MG tablet [Pharmacy Med Name: SERTRALINE HCL 50 MG TABLET] 90 tablet 1    Sig: TAKE 1 TABLET BY MOUTH EVERY DAY     Psychiatry:  Antidepressants - SSRI - sertraline Passed - 03/13/2023  2:34 AM      Passed - AST in normal range and within 360 days    AST  Date Value Ref Range Status  10/03/2022 25 0 - 40 IU/L Final         Passed - ALT in normal range and within 360 days    ALT  Date Value Ref Range Status  10/03/2022 27 0 - 32 IU/L Final         Passed - Completed PHQ-2 or PHQ-9 in the last 360 days      Passed - Valid encounter within last 6 months    Recent Outpatient Visits           2 months ago Class 1 obesity due to excess calories with serious comorbidity and body mass index (BMI) of 33.0 to 33.9 in adult   [facility] [place], Marzella Schlein, MD   5 months ago Encounter for annual physical exam   [place] [facility] [place], Marzella Schlein, MD   1 year ago Anxiety   [place] [facility] [place], Marzella Schlein, MD   1 year ago Encounter for annual physical exam   [place] [facility] [place], Marzella Schlein, MD   1 year ago Acute non-recurrent sinusitis, unspecified location   [facility] [place], Darl Householder, DO       Future Appointments             In 6 months Bacigalupo, Marzella Schlein, MD [facility]

## 2023-04-02 ENCOUNTER — Other Ambulatory Visit: Payer: Self-pay | Admitting: Family Medicine

## 2023-04-03 NOTE — Telephone Encounter (Signed)
Requested Prescriptions  Pending Prescriptions Disp Refills   meloxicam (MOBIC) 15 MG tablet [Pharmacy Med Name: MELOXICAM 15 MG TABLET] 90 tablet 0    Sig: TAKE 1 TABLET (15 MG TOTAL) BY MOUTH DAILY.     Analgesics:  COX2 Inhibitors Failed - 04/02/2023  2:04 AM      Failed - Manual Review: Labs are only required if the patient has taken medication for more than 8 weeks.      Failed - HGB in normal range and within 360 days    Hemoglobin  Date Value Ref Range Status  03/26/2020 13.3 11.1 - 15.9 g/dL Final         Failed - HCT in normal range and within 360 days    Hematocrit  Date Value Ref Range Status  03/26/2020 38.6 34.0 - 46.6 % Final         Passed - Cr in normal range and within 360 days    Creatinine, Ser  Date Value Ref Range Status  10/03/2022 0.98 0.57 - 1.00 mg/dL Final         Passed - AST in normal range and within 360 days    AST  Date Value Ref Range Status  10/03/2022 25 0 - 40 IU/L Final         Passed - ALT in normal range and within 360 days    ALT  Date Value Ref Range Status  10/03/2022 27 0 - 32 IU/L Final         Passed - eGFR is 30 or above and within 360 days    GFR calc Af Amer  Date Value Ref Range Status  03/26/2020 93 >59 mL/min/1.73 Final    Comment:    **In accordance with recommendations from the NKF-ASN Task force,**   [facility] is in the process of updating its eGFR calculation to the   2021 CKD-EPI creatinine equation that estimates kidney function   without a race variable.    GFR calc non Af Amer  Date Value Ref Range Status  03/26/2020 80 >59 mL/min/1.73 Final   eGFR  Date Value Ref Range Status  10/03/2022 66 >59 mL/min/1.73 Final         Passed - Patient is not pregnant      Passed - Valid encounter within last 12 months    Recent Outpatient Visits           2 months ago Class 1 obesity due to excess calories with serious comorbidity and body mass index (BMI) of 33.0 to 33.9 in adult   [facility] [place], Marzella Schlein, MD   6 months ago Encounter for annual physical exam   [facility] [place], Marzella Schlein, MD   1 year ago Anxiety   [place] [facility] [place], Marzella Schlein, MD   1 year ago Encounter for annual physical exam   [place] [facility] [place] [place], Marzella Schlein, MD   1 year ago Acute non-recurrent sinusitis, unspecified location   [facility] [place], Darl Householder, DO       Future Appointments             In 6 months Bacigalupo, Marzella Schlein, MD [facility]

## 2023-05-02 ENCOUNTER — Other Ambulatory Visit: Payer: Self-pay | Admitting: Family Medicine

## 2023-05-02 DIAGNOSIS — Z1231 Encounter for screening mammogram for malignant neoplasm of breast: Secondary | ICD-10-CM

## 2023-06-05 ENCOUNTER — Ambulatory Visit
Admission: RE | Admit: 2023-06-05 | Discharge: 2023-06-05 | Disposition: A | Discharge status: Home / Self Care | Payer: 59 | Source: Ambulatory Visit | Attending: Family Medicine | Admitting: Family Medicine

## 2023-06-05 DIAGNOSIS — Z1231 Encounter for screening mammogram for malignant neoplasm of breast: Secondary | ICD-10-CM | POA: Insufficient documentation

## 2023-06-06 ENCOUNTER — Other Ambulatory Visit: Payer: Self-pay | Admitting: Family Medicine

## 2023-06-06 DIAGNOSIS — E7800 Pure hypercholesterolemia, unspecified: Secondary | ICD-10-CM

## 2023-06-06 NOTE — Telephone Encounter (Signed)
 Requested Prescriptions  Pending Prescriptions Disp Refills   atorvastatin  (LIPITOR) 10 MG tablet [Pharmacy Med Name: ATORVASTATIN  10 MG TABLET] 90 tablet 1    Sig: TAKE 1 TABLET BY MOUTH EVERY DAY     Cardiovascular:  Antilipid - Statins Failed - 06/06/2023  5:55 PM      Failed - Lipid Panel in normal range within the last 12 months    Cholesterol, Total  Date Value Ref Range Status  10/03/2022 144 100 - 199 mg/dL Final   LDL Chol Calc (NIH)  Date Value Ref Range Status  10/03/2022 64 0 - 99 mg/dL Final   HDL  Date Value Ref Range Status  10/03/2022 44 >39 mg/dL Final   Triglycerides  Date Value Ref Range Status  10/03/2022 220 (H) 0 - 149 mg/dL Final         Passed - Patient is not pregnant      Passed - Valid encounter within last 12 months    Recent Outpatient Visits           5 months ago Class 1 obesity due to excess calories with serious comorbidity and body mass index (BMI) of 33.0 to 33.9 in adult   [facility] [place], Stan Eans, MD   8 months ago Encounter for annual physical exam   [facility] [place], Stan Eans, MD   1 year ago Anxiety   [place] [facility] [place], Stan Eans, MD   1 year ago Encounter for annual physical exam   [place] [facility] [place], Stan Eans, MD   1 year ago Acute non-recurrent sinusitis, unspecified location   [facility] [place], Jarome Merritt, DO       Future Appointments             In 4 months Bacigalupo, Stan Eans, MD [facility]

## 2023-06-29 ENCOUNTER — Other Ambulatory Visit: Payer: Self-pay | Admitting: Family Medicine

## 2023-06-29 NOTE — Telephone Encounter (Signed)
 Requested Prescriptions  Pending Prescriptions Disp Refills   meloxicam  (MOBIC ) 15 MG tablet [Pharmacy Med Name: MELOXICAM  15 MG TABLET] 90 tablet 0    Sig: TAKE 1 TABLET (15 MG TOTAL) BY MOUTH DAILY.     Analgesics:  COX2 Inhibitors Failed - 06/29/2023  2:45 PM      Failed - Manual Review: Labs are only required if the patient has taken medication for more than 8 weeks.      Failed - HGB in normal range and within 360 days    Hemoglobin  Date Value Ref Range Status  03/26/2020 13.3 11.1 - 15.9 g/dL Final         Failed - HCT in normal range and within 360 days    Hematocrit  Date Value Ref Range Status  03/26/2020 38.6 34.0 - 46.6 % Final         Passed - Cr in normal range and within 360 days    Creatinine, Ser  Date Value Ref Range Status  10/03/2022 0.98 0.57 - 1.00 mg/dL Final         Passed - AST in normal range and within 360 days    AST  Date Value Ref Range Status  10/03/2022 25 0 - 40 IU/L Final         Passed - ALT in normal range and within 360 days    ALT  Date Value Ref Range Status  10/03/2022 27 0 - 32 IU/L Final         Passed - eGFR is 30 or above and within 360 days    GFR calc Af Amer  Date Value Ref Range Status  03/26/2020 93 >59 mL/min/1.73 Final    Comment:    **In accordance with recommendations from the NKF-ASN Task force,**   [facility] is in the process of updating its eGFR calculation to the   2021 CKD-EPI creatinine equation that estimates kidney function   without a race variable.    GFR calc non Af Amer  Date Value Ref Range Status  03/26/2020 80 >59 mL/min/1.73 Final   eGFR  Date Value Ref Range Status  10/03/2022 66 >59 mL/min/1.73 Final         Passed - Patient is not pregnant      Passed - Valid encounter within last 12 months    Recent Outpatient Visits           5 months ago Class 1 obesity due to excess calories with serious comorbidity and body mass index (BMI) of 33.0 to 33.9 in adult   [facility] [place], Jon HERO, MD   9 months ago Encounter for annual physical exam   [facility] [place], Jon HERO, MD   1 year ago Anxiety   [place] [facility], Jon HERO, MD   1 year ago Encounter for annual physical exam   [place] [facility] [place], Jon HERO, MD   2 years ago Acute non-recurrent sinusitis, unspecified location   [facility] [place], Donald HERO, DO       Future Appointments             In 3 months Bacigalupo, Jon HERO, MD [facility]

## 2023-09-03 ENCOUNTER — Other Ambulatory Visit: Payer: Self-pay | Admitting: Family Medicine

## 2023-09-03 DIAGNOSIS — E7800 Pure hypercholesterolemia, unspecified: Secondary | ICD-10-CM

## 2023-09-04 NOTE — Telephone Encounter (Signed)
 Requested Prescriptions  Refused Prescriptions Disp Refills   atorvastatin (LIPITOR) 10 MG tablet [Pharmacy Med Name: ATORVASTATIN 10 MG TABLET] 90 tablet 1    Sig: TAKE 1 TABLET BY MOUTH EVERY DAY     Cardiovascular:  Antilipid - Statins Failed - 09/04/2023  1:46 PM      Failed - Valid encounter within last 12 months    Recent Outpatient Visits   None     Future Appointments             In 1 month Bacigalupo, Stan Eans, MD [facility]            Failed - Lipid Panel in normal range within the last 12 months    Cholesterol, Total  Date Value Ref Range Status  10/03/2022 144 100 - 199 mg/dL Final   LDL Chol Calc (NIH)  Date Value Ref Range Status  10/03/2022 64 0 - 99 mg/dL Final   HDL  Date Value Ref Range Status  10/03/2022 44 >39 mg/dL Final   Triglycerides  Date Value Ref Range Status  10/03/2022 220 (H) 0 - 149 mg/dL Final         Passed - Patient is not pregnant

## 2023-09-05 ENCOUNTER — Other Ambulatory Visit: Payer: Self-pay | Admitting: Family Medicine

## 2023-09-05 DIAGNOSIS — F419 Anxiety disorder, unspecified: Secondary | ICD-10-CM

## 2023-09-05 DIAGNOSIS — I10 Essential (primary) hypertension: Secondary | ICD-10-CM

## 2023-09-06 NOTE — Telephone Encounter (Signed)
 Requested medication (s) are due for refill today:   Yes for both  Requested medication (s) are on the active medication list:   Yes for both  Future visit scheduled:   Yes 10/04/2023 Dr. David Escort   Last ordered:  both 03/14/2023 #90, 1 refill  Unable to refill because labs are due per protocol.      Requested Prescriptions  Pending Prescriptions Disp Refills   triamterene-hydrochlorothiazide (DYAZIDE) 37.5-25 MG capsule [Pharmacy Med Name: TRIAMTERENE-HCTZ 37.5-25 MG CP] 90 capsule 1    Sig: TAKE 1 CAPSULE BY MOUTH EVERY DAY     Cardiovascular: Diuretic Combos Failed - 09/06/2023  9:19 AM      Failed - K in normal range and within 180 days    Potassium  Date Value Ref Range Status  10/03/2022 4.0 3.5 - 5.2 mmol/L Final         Failed - Na in normal range and within 180 days    Sodium  Date Value Ref Range Status  10/03/2022 142 134 - 144 mmol/L Final         Failed - Cr in normal range and within 180 days    Creatinine, Ser  Date Value Ref Range Status  10/03/2022 0.98 0.57 - 1.00 mg/dL Final         Failed - Valid encounter within last 6 months    Recent Outpatient Visits   None     Future Appointments             In 4 weeks Bacigalupo, Stan Eans, MD [place] [facility]            Passed - Last BP in normal range    BP Readings from Last 1 Encounters:  01/04/23 123/71          sertraline (ZOLOFT) 50 MG tablet [Pharmacy Med Name: SERTRALINE HCL 50 MG TABLET] 90 tablet 1    Sig: TAKE 1 TABLET BY MOUTH EVERY DAY     Psychiatry:  Antidepressants - SSRI - sertraline Failed - 09/06/2023  9:19 AM      Failed - Valid encounter within last 6 months    Recent Outpatient Visits   None     Future Appointments             In 4 weeks Bacigalupo, Stan Eans, MD [facility]            Passed - AST in normal range and within 360 days    AST  Date Value Ref Range Status  10/03/2022 25 0 - 40 IU/L Final          Passed - ALT in normal range and within 360 days    ALT  Date Value Ref Range Status  10/03/2022 27 0 - 32 IU/L Final         Passed - Completed PHQ-2 or PHQ-9 in the last 360 days

## 2023-09-10 ENCOUNTER — Ambulatory Visit: Admitting: Family Medicine

## 2023-10-04 ENCOUNTER — Other Ambulatory Visit (HOSPITAL_COMMUNITY): Payer: Self-pay

## 2023-10-04 ENCOUNTER — Ambulatory Visit: Payer: Self-pay | Admitting: Family Medicine

## 2023-10-04 ENCOUNTER — Encounter: Payer: Self-pay | Admitting: Family Medicine

## 2023-10-04 ENCOUNTER — Telehealth: Payer: Self-pay

## 2023-10-04 VITALS — BP 117/77 | HR 70 | Ht 64.0 in | Wt 180.9 lb

## 2023-10-04 DIAGNOSIS — Z0000 Encounter for general adult medical examination without abnormal findings: Secondary | ICD-10-CM | POA: Diagnosis not present

## 2023-10-04 DIAGNOSIS — R7303 Prediabetes: Secondary | ICD-10-CM | POA: Diagnosis not present

## 2023-10-04 DIAGNOSIS — F419 Anxiety disorder, unspecified: Secondary | ICD-10-CM

## 2023-10-04 DIAGNOSIS — E7800 Pure hypercholesterolemia, unspecified: Secondary | ICD-10-CM

## 2023-10-04 DIAGNOSIS — K5903 Drug induced constipation: Secondary | ICD-10-CM

## 2023-10-04 DIAGNOSIS — I10 Essential (primary) hypertension: Secondary | ICD-10-CM

## 2023-10-04 DIAGNOSIS — E66811 Obesity, class 1: Secondary | ICD-10-CM | POA: Diagnosis not present

## 2023-10-04 DIAGNOSIS — E6609 Other obesity due to excess calories: Secondary | ICD-10-CM

## 2023-10-04 DIAGNOSIS — Z6831 Body mass index (BMI) 31.0-31.9, adult: Secondary | ICD-10-CM

## 2023-10-04 DIAGNOSIS — M7061 Trochanteric bursitis, right hip: Secondary | ICD-10-CM

## 2023-10-04 MED ORDER — PHENTERMINE HCL 37.5 MG PO CAPS: 37.5000 mg | ORAL_CAPSULE | ORAL | 2 refills | Status: DC

## 2023-10-04 NOTE — Assessment & Plan Note (Signed)
 Hyperlipidemia is managed with atorvastatin . Plan to check cholesterol levels with upcoming lab work. - Continue atorvastatin  for cholesterol management. - Order lab tests to check cholesterol levels.

## 2023-10-04 NOTE — Assessment & Plan Note (Signed)
 Recommend low carb diet Recheck A1c

## 2023-10-04 NOTE — Progress Notes (Signed)
 Complete physical exam   Patient: Rachel Ali   DOB: 07/20/1960   63 y.o. Female  MRN: [id number] Visit Date: 10/04/2023  Today's healthcare provider: Aden Agreste, MD   Chief Complaint  Patient presents with   Annual Exam    Last CPE 10/02/2022 Diet-  None Exercise- Walk on the treadmill 5 days a week for 30 mins, Stretching exercises 3 days a week for 30 mins.   Feeling - Reports that she feels really tired, at least one day a week.  Sleep - Really good  Concerns - Just her weight, would like to lose at least 20 lbs.     Subjective    Rachel Ali is a 63 y.o. female who presents today for a complete physical exam.   Discussed the use of AI scribe software for clinical note transcription with the patient, who gave verbal consent to proceed.  History of Present Illness   Rachel Ali is a 63 year old female who presents for a physical exam with concerns about weight management and hip pain.  She has gained weight from 174 to 180 pounds after stopping phentermine , which she had been using for weight loss. Her hip pain, located in the right hip and extending from the low back to the lateral hip, has worsened with the weight gain. Modifications to her truck, such as lowering the rails, have provided slight relief. She has been off phentermine  for over three months and wishes to resume it as other options are not covered.  She is currently taking Zoloft  50 mg for depression, which is well-managed despite the stress of caregiving for her 65 year old mother. She experiences constipation, likely due to calcium  supplements, and has stopped taking them. She uses stool softeners twice a week and has increased her intake of leafy greens and fruits.  She continues to take atorvastatin  for cholesterol and Dyazide for blood pressure, both of which are well-controlled. She experiences occasional sluggishness a couple of times a month and has reduced her nap frequency. Sleep disturbances are  attributed to her cat waking her at night.        Last depression screening scores    10/04/2023   10:08 AM 01/04/2023   10:24 AM 10/02/2022    4:11 PM  PHQ 2/9 Scores  PHQ - 2 Score 1 0 0  PHQ- 9 Score 3  1   Last fall risk screening    01/04/2023   10:24 AM  Fall Risk   Falls in the past year? 0  Number falls in past yr: 0  Injury with Fall? 0  Risk for fall due to : No Fall Risks  Follow up Falls evaluation completed        Medications: Outpatient Medications Prior to Visit  Medication Sig   acidophilus (RISAQUAD) CAPS capsule Take 1 capsule by mouth daily.   atorvastatin  (LIPITOR) 10 MG tablet TAKE 1 TABLET BY MOUTH EVERY DAY   Azelaic Acid  (FINACEA ) 15 % FOAM Apply to face once to twice a day for rosacea.   Cholecalciferol 1000 units capsule Take by mouth.   famotidine (PEPCID) 20 MG tablet Take 20 mg by mouth 2 (two) times daily.   FLAXSEED, LINSEED, PO Take by mouth.   meloxicam  (MOBIC ) 15 MG tablet TAKE 1 TABLET (15 MG TOTAL) BY MOUTH DAILY.   mometasone  (ELOCON ) 0.1 % cream Apply 1 application topically daily as needed (Rash). Avoid applying to face, groin, and axilla. Use as directed. Risk  of skin atrophy with long-term use reviewed.   Multiple Vitamin tablet Take by mouth.   sertraline  (ZOLOFT ) 50 MG tablet TAKE 1 TABLET BY MOUTH EVERY DAY   traZODone  (DESYREL ) 50 MG tablet TAKE 1 TABLET BY MOUTH EVERYDAY AT BEDTIME   triamterene -hydrochlorothiazide (DYAZIDE) 37.5-25 MG capsule TAKE 1 CAPSULE BY MOUTH EVERY DAY   vitamin E 180 MG (400 UNITS) capsule Take 400 Units by mouth daily.   [DISCONTINUED] phentermine  37.5 MG capsule Take 1 capsule (37.5 mg total) by mouth every morning.   No facility-administered medications prior to visit.    Review of Systems     Objective    BP 117/77 (BP Location: Left Arm, Patient Position: Sitting, Cuff Size: Normal)   Pulse 70   Ht 5\' 4"  (1.626 m)   Wt 180 lb 14.4 oz (82.1 kg)   SpO2 97%   BMI 31.05 kg/m     Physical Exam Vitals reviewed.  Constitutional:      General: She is not in acute distress.    Appearance: Normal appearance. She is well-developed. She is not diaphoretic.  HENT:     Head: Normocephalic and atraumatic.     Right Ear: Tympanic membrane, ear canal and external ear normal.     Left Ear: Tympanic membrane, ear canal and external ear normal.     Nose: Nose normal.     Mouth/Throat:     Mouth: Mucous membranes are moist.     Pharynx: Oropharynx is clear. No oropharyngeal exudate.  Eyes:     General: No scleral icterus.    Conjunctiva/sclera: Conjunctivae normal.     Pupils: Pupils are equal, round, and reactive to light.  Neck:     Thyroid: No thyromegaly.  Cardiovascular:     Rate and Rhythm: Normal rate and regular rhythm.     Heart sounds: Normal heart sounds. No murmur heard. Pulmonary:     Effort: Pulmonary effort is normal. No respiratory distress.     Breath sounds: Normal breath sounds. No wheezing or rales.  Abdominal:     General: There is no distension.     Palpations: Abdomen is soft.     Tenderness: There is no abdominal tenderness.  Musculoskeletal:        General: No deformity.     Cervical back: Neck supple.     Right lower leg: No edema.     Left lower leg: No edema.  Lymphadenopathy:     Cervical: No cervical adenopathy.  Skin:    General: Skin is warm and dry.     Findings: No rash.  Neurological:     Mental Status: She is alert and oriented to person, place, and time. Mental status is at baseline.     Gait: Gait normal.  Psychiatric:        Mood and Affect: Mood normal.        Behavior: Behavior normal.        Thought Content: Thought content normal.      No results found for any visits on 10/04/23.  Assessment & Plan    Routine Health Maintenance and Physical Exam  Exercise Activities and Dietary recommendations  Goals   None     Immunization History  Administered Date(s) Administered   Influenza, Mdck, Trivalent,PF  6+ MOS(egg free) 04/20/2023   Influenza,inj,Quad PF,6+ Mos 02/22/2013, 03/13/2015, 03/18/2016, 02/24/2017, 03/20/2018, 03/12/2019, 03/26/2020, 03/09/2022   Moderna Covid-19 Vaccine Bivalent Booster 9yrs & up 05/11/2020, 02/11/2021   Moderna Sars-Covid-2 Vaccination 08/18/2019, 09/15/2019  Tdap 03/26/2020   Zoster Recombinant(Shingrix ) 09/05/2021, 03/09/2022    Health Maintenance  Topic Date Due   COVID-19 Vaccine (5 - 2024-25 season) 01/21/2023   INFLUENZA VACCINE  12/21/2023   MAMMOGRAM  06/04/2024   Colonoscopy  08/28/2024   Cervical Cancer Screening (HPV/Pap Cotest)  03/26/2025   DTaP/Tdap/Td (2 - Td or Tdap) 03/26/2030   Hepatitis C Screening  Completed   HIV Screening  Completed   Zoster Vaccines- Shingrix   Completed   HPV VACCINES  Aged Out   Meningococcal B Vaccine  Aged Out    Discussed health benefits of physical activity, and encouraged her to engage in regular exercise appropriate for her age and condition.  Problem List Items Addressed This Visit       Cardiovascular and Mediastinum   Hypertension   Hypertension is well-controlled on current medication regimen. Blood pressure readings are satisfactory. - Continue current antihypertensive medication regimen (hydrochlorothiazide and triamterene ).      Relevant Orders   Comprehensive metabolic panel with GFR     Other   Anxiety   well-managed on current medication regimen. She is taking care of an elderly mother, which adds stress. Current medication, Zoloft  50 mg, is effective with no reported issues. - Continue Zoloft  50 mg daily.      Prediabetes   Recommend low carb diet Recheck A1c       Relevant Orders   Hemoglobin A1c   Obese   Weight gain after discontinuation of phentermine , with a current weight of 180 lbs, up from 174 lbs. Previous weight loss of 12 lbs since August. Phentermine  was effective in weight management, and other options are not covered by insurance. Blood pressure is stable,  allowing for the reintroduction of phentermine . - Prescribe phentermine  for three months for weight management. - Encourage increased physical activity, especially during upcoming travel to [place].      Relevant Medications   phentermine  37.5 MG capsule   Hypercholesterolemia   Hyperlipidemia is managed with atorvastatin . Plan to check cholesterol levels with upcoming lab work. - Continue atorvastatin  for cholesterol management. - Order lab tests to check cholesterol levels.      Relevant Orders   Comprehensive metabolic panel with GFR   Lipid panel   Other Visit Diagnoses       Encounter for annual physical exam    -  Primary   Relevant Orders   Comprehensive metabolic panel with GFR   Lipid panel   Hemoglobin A1c     Trochanteric bursitis of right hip         Drug-induced constipation               Hip pain due to trochanteric bursitis Chronic right hip pain, likely due to bursitis, localized to the lateral hip and low back area. Pain exacerbated by weight gain and relieved with weight loss. No signs of hip joint arthritis. Discussed the role of repetitive motion and lack of motion in exacerbating bursitis. Meloxicam  is recommended for pain management, with the option of a steroid injection if pain persists. - Prescribe meloxicam  for pain management, to be taken daily for a couple of weeks and then as needed. - Advise use of ice for pain relief. - Consider steroid injection if pain does not improve with meloxicam .  Constipation Constipation possibly related to calcium  supplementation. She has stopped calcium  to improve bowel movements and is using stool softeners twice a week. Probiotics are helping with regularity. Discussed dietary sources of calcium  and the potential use  of MiraLAX if needed. - Encourage dietary calcium  intake through leafy greens and other foods. - Continue use of stool softeners as needed. - Consider MiraLAX if stool softeners become  ineffective.  General Health Maintenance Routine screenings and immunizations are up to date. Pap smear and colonoscopy are due next year. Mammogram is due in January. Tetanus shot is valid until 2031. - Schedule Pap smear and colonoscopy for next year. - Schedule mammogram for January. - Administer flu shot in the fall.        Return in about 6 months (around 04/05/2024) for chronic disease f/u.     Aden Agreste, MD  [facility] [phone] (phone) [phone] (fax)  [facility]

## 2023-10-04 NOTE — Assessment & Plan Note (Signed)
 Hypertension is well-controlled on current medication regimen. Blood pressure readings are satisfactory. - Continue current antihypertensive medication regimen (hydrochlorothiazide and triamterene ).

## 2023-10-04 NOTE — Telephone Encounter (Signed)
 Pharmacy Patient Advocate Encounter  Received notification from [facility] that Prior Authorization for Phentermine  37.5 mg capsules  has been DENIED.  Full denial letter will be uploaded to the media tab. See denial reason below.   PA #/Case ID/Reference #: [id number]   *Insurance will only cover this drug for a total of 3 months within a 365 day period. She can get it any [facility] for $13.62 or she can use [facility] to pay out of pocket.

## 2023-10-04 NOTE — Telephone Encounter (Signed)
 Pharmacy Patient Advocate Encounter   Received notification from Onbase that prior authorization for Phentermine  HCl 37.5MG  capsules  is required/requested.   Insurance verification completed.   The patient is insured through [facility] .   Per test claim: PA required; PA submitted to above mentioned insurance via CoverMyMeds Key/confirmation #/EOC [id number] Status is pending

## 2023-10-04 NOTE — Assessment & Plan Note (Signed)
 well-managed on current medication regimen. She is taking care of an elderly mother, which adds stress. Current medication, Zoloft  50 mg, is effective with no reported issues. - Continue Zoloft  50 mg daily.

## 2023-10-04 NOTE — Assessment & Plan Note (Signed)
 Weight gain after discontinuation of phentermine , with a current weight of 180 lbs, up from 174 lbs. Previous weight loss of 12 lbs since August. Phentermine  was effective in weight management, and other options are not covered by insurance. Blood pressure is stable, allowing for the reintroduction of phentermine . - Prescribe phentermine  for three months for weight management. - Encourage increased physical activity, especially during upcoming travel to [place].

## 2023-10-05 ENCOUNTER — Ambulatory Visit: Payer: Self-pay | Admitting: Family Medicine

## 2023-10-05 LAB — LIPID PANEL
Chol/HDL Ratio: 3.5 ratio (ref 0.0–4.4)
Cholesterol, Total: 158 mg/dL (ref 100–199)
HDL: 45 mg/dL (ref >39)
LDL Chol Calc (NIH): 80 mg/dL (ref 0–99)
Triglycerides: 193 mg/dL — ABNORMAL HIGH (ref 0–149)
VLDL Cholesterol Cal: 33 mg/dL (ref 5–40)

## 2023-10-05 LAB — COMPREHENSIVE METABOLIC PANEL WITH GFR
ALT: 21 IU/L (ref 0–32)
AST: 22 IU/L (ref 0–40)
Albumin: 4.4 g/dL (ref 3.9–4.9)
Alkaline Phosphatase: 134 IU/L — ABNORMAL HIGH (ref 44–121)
BUN/Creatinine Ratio: 14 (ref 12–28)
BUN: 16 mg/dL (ref 8–27)
Bilirubin Total: 0.3 mg/dL (ref 0.0–1.2)
CO2: 26 mmol/L (ref 20–29)
Calcium: 9.3 mg/dL (ref 8.7–10.3)
Chloride: 101 mmol/L (ref 96–106)
Creatinine, Ser: 1.11 mg/dL — ABNORMAL HIGH (ref 0.57–1.00)
Globulin, Total: 2 g/dL (ref 1.5–4.5)
Glucose: 94 mg/dL (ref 70–99)
Potassium: 4 mmol/L (ref 3.5–5.2)
Sodium: 141 mmol/L (ref 134–144)
Total Protein: 6.4 g/dL (ref 6.0–8.5)
eGFR: 57 mL/min/{1.73_m2} — ABNORMAL LOW (ref >59)

## 2023-10-05 LAB — HEMOGLOBIN A1C
Est. average glucose Bld gHb Est-mCnc: 123 mg/dL
Hgb A1c MFr Bld: 5.9 % — ABNORMAL HIGH (ref 4.8–5.6)

## 2023-10-09 ENCOUNTER — Other Ambulatory Visit: Payer: Self-pay

## 2023-10-09 DIAGNOSIS — N289 Disorder of kidney and ureter, unspecified: Secondary | ICD-10-CM

## 2023-10-24 LAB — BASIC METABOLIC PANEL WITH GFR
BUN/Creatinine Ratio: 17 (ref 12–28)
BUN: 16 mg/dL (ref 8–27)
CO2: 23 mmol/L (ref 20–29)
Calcium: 9.3 mg/dL (ref 8.7–10.3)
Chloride: 100 mmol/L (ref 96–106)
Creatinine, Ser: 0.94 mg/dL (ref 0.57–1.00)
Glucose: 105 mg/dL — ABNORMAL HIGH (ref 70–99)
Potassium: 3.9 mmol/L (ref 3.5–5.2)
Sodium: 143 mmol/L (ref 134–144)
eGFR: 69 mL/min/{1.73_m2} (ref >59)

## 2023-10-29 ENCOUNTER — Ambulatory Visit: Payer: Self-pay | Admitting: Family Medicine

## 2023-10-29 ENCOUNTER — Other Ambulatory Visit: Payer: Self-pay | Admitting: Dermatology

## 2023-10-29 DIAGNOSIS — L719 Rosacea, unspecified: Secondary | ICD-10-CM

## 2023-12-01 ENCOUNTER — Other Ambulatory Visit: Payer: Self-pay | Admitting: Family Medicine

## 2023-12-01 DIAGNOSIS — I10 Essential (primary) hypertension: Secondary | ICD-10-CM

## 2023-12-01 DIAGNOSIS — F419 Anxiety disorder, unspecified: Secondary | ICD-10-CM

## 2023-12-03 NOTE — Telephone Encounter (Signed)
 Requested Prescriptions  Pending Prescriptions Disp Refills   sertraline  (ZOLOFT ) 50 MG tablet [Pharmacy Med Name: SERTRALINE  HCL 50 MG TABLET] 90 tablet 1    Sig: TAKE 1 TABLET BY MOUTH EVERY DAY     Psychiatry:  Antidepressants - SSRI - sertraline  Passed - 12/03/2023  3:53 PM      Passed - AST in normal range and within 360 days    AST  Date Value Ref Range Status  10/04/2023 22 0 - 40 IU/L Final         Passed - ALT in normal range and within 360 days    ALT  Date Value Ref Range Status  10/04/2023 21 0 - 32 IU/L Final         Passed - Completed PHQ-2 or PHQ-9 in the last 360 days      Passed - Valid encounter within last 6 months    Recent Outpatient Visits           2 months ago Encounter for annual physical exam   [facility] [place], Jon HERO, MD               triamterene -hydrochlorothiazide (DYAZIDE) 37.5-25 MG capsule [Pharmacy Med Name: TRIAMTERENE -HCTZ 37.5-25 MG CP] 90 capsule 1    Sig: TAKE 1 CAPSULE BY MOUTH EVERY DAY     Cardiovascular: Diuretic Combos Passed - 12/03/2023  3:53 PM      Passed - K in normal range and within 180 days    Potassium  Date Value Ref Range Status  10/23/2023 3.9 3.5 - 5.2 mmol/L Final         Passed - Na in normal range and within 180 days    Sodium  Date Value Ref Range Status  10/23/2023 143 134 - 144 mmol/L Final         Passed - Cr in normal range and within 180 days    Creatinine, Ser  Date Value Ref Range Status  10/23/2023 0.94 0.57 - 1.00 mg/dL Final         Passed - Last BP in normal range    BP Readings from Last 1 Encounters:  10/04/23 117/77         Passed - Valid encounter within last 6 months    Recent Outpatient Visits           2 months ago Encounter for annual physical exam   [facility] [place], Jon HERO, MD

## 2023-12-07 ENCOUNTER — Ambulatory Visit
Admission: RE | Admit: 2023-12-07 | Discharge: 2023-12-07 | Disposition: A | Discharge status: Home / Self Care | Attending: Family Medicine | Admitting: Family Medicine

## 2023-12-07 ENCOUNTER — Ambulatory Visit: Payer: Self-pay

## 2023-12-07 ENCOUNTER — Ambulatory Visit: Admitting: Family Medicine

## 2023-12-07 ENCOUNTER — Ambulatory Visit
Admission: RE | Admit: 2023-12-07 | Discharge: 2023-12-07 | Disposition: A | Discharge status: Home / Self Care | Source: Ambulatory Visit | Attending: Family Medicine | Admitting: Family Medicine

## 2023-12-07 ENCOUNTER — Encounter: Payer: Self-pay | Admitting: Family Medicine

## 2023-12-07 VITALS — BP 126/82 | HR 86 | Temp 98.0°F | Ht 64.0 in | Wt 176.6 lb

## 2023-12-07 DIAGNOSIS — M25561 Pain in right knee: Secondary | ICD-10-CM

## 2023-12-07 NOTE — Progress Notes (Signed)
 Acute Office Visit  Introduced to nurse practitioner role and practice setting.  All questions answered.  Discussed provider/patient relationship and expectations.   Subjective:     Patient ID: Rachel Ali, female    DOB: 1960-08-06, 63 y.o.   MRN: [id number]  Chief Complaint  Patient presents with   Acute Visit    Right knee pain due to tripping and landed on her knee.  Almost has been a month since the fall.  Went to UC and they stated nothing was broken but is concerned with it still being swollen and painful.    Discussed the use of AI scribe software for clinical note transcription with the patient, who gave verbal consent to proceed.  Worse when lay down and sleep, going down stairs, will get nauseous due to pain.  History of Present Illness Rachel Ali is a 63 year old female who presents with persistent knee pain following a fall.  She has been experiencing constant knee pain since a fall, which worsens when laying down, sleeping, or descending stairs. The pain is severe enough to cause nausea by the end of the day. Despite the pain, she continues her walking routine to avoid weight gain.  She visited urgent care where no imaging was performed. She was prescribed antibiotics and nausea medication due to a scrape on her knee. She has been taking meloxicam , which she had for carpal tunnel syndrome, and reports it helps with the pain.  The pain is localized to a specific area on her knee, which is tender and burns upon touch. She notes that the knee swelled significantly after the fall, but since has gone down.      Review of Systems  Musculoskeletal:  Positive for joint pain.  All other systems reviewed and are negative.     Objective:    BP 126/82 (BP Location: Left Arm, Patient Position: Sitting, Cuff Size: Normal)   Pulse 86   Temp 98 F (36.7 C) (Oral)   Ht 5' 4 (1.626 m)   Wt 176 lb 9.6 oz (80.1 kg)   SpO2 98%   BMI 30.31 kg/m    Physical  Exam Constitutional:      General: She is not in acute distress.    Appearance: Normal appearance. She is well-developed, well-groomed and overweight. She is not ill-appearing, toxic-appearing or diaphoretic.  HENT:     Head: Normocephalic.     Nose: Nose normal.     Mouth/Throat:     Mouth: Mucous membranes are moist.     Pharynx: Oropharynx is clear.  Eyes:     Extraocular Movements: Extraocular movements intact.     Pupils: Pupils are equal, round, and reactive to light.  Cardiovascular:     Rate and Rhythm: Normal rate and regular rhythm.     Pulses: Normal pulses.     Heart sounds: Normal heart sounds. No murmur heard.    No friction rub. No gallop.  Pulmonary:     Effort: No respiratory distress.     Breath sounds: No stridor. No wheezing, rhonchi or rales.  Chest:     Chest wall: No tenderness.  Musculoskeletal:     Right knee: Swelling, bony tenderness and crepitus present. Normal range of motion. Tenderness present over the medial joint line and patellar tendon. No LCL laxity, MCL laxity, ACL laxity or PCL laxity. Abnormal pulse.     Instability Tests: Anterior drawer test negative. Posterior drawer test negative. Anterior Lachman test negative. Medial McMurray test  negative and lateral McMurray test negative.     Left knee: Crepitus present.     Right lower leg: No swelling, deformity, tenderness or bony tenderness. No edema.     Left lower leg: No swelling, deformity, lacerations, tenderness or bony tenderness. No edema.     Right ankle: Swelling present.     Left ankle: Swelling present.  Skin:    General: Skin is warm and dry.     Capillary Refill: Capillary refill takes less than 2 seconds.  Neurological:     General: No focal deficit present.     Mental Status: She is alert and oriented to person, place, and time. Mental status is at baseline.  Psychiatric:        Mood and Affect: Mood normal.        Behavior: Behavior normal.        Thought Content: Thought  content normal.        Judgment: Judgment normal.     No results found for any visits on 12/07/23.      Assessment & Plan:  Assessment and Plan Assessment & Plan Acute Knee Pain Fracture vs contusion, with possible mild fluid accumulation Persistent knee pain post-fall with suspected contusion and possible fluid accumulation. Imaging recommended to rule out fractures.. Explained healing from knee contusion can take 1-2 months to heal. Meloxicam  deemed safe for short-term use with normal kidney function in June 2025. Advised against concurrent NSAID use due to renal and cardiovascular risks. - Order R knee x-ray to rule out fracture . - Continue meloxicam  once daily for pain management. - Advise alternating heat and ice application to manage inflammation and promote healing. - Recommend elevation of the knee and activity limitation as tolerated. - Advise against using NSAID gels due to current meloxicam  use. - Consider referral to orthopedics if x-ray shows abnormalities or if symptoms worsen. - may wear brace if helpful to patient.    Problem List Items Addressed This Visit   None Visit Diagnoses       Acute pain of right knee    -  Primary   Relevant Orders   DG Knee Complete 4 Views Right       No orders of the defined types were placed in this encounter.   Return if symptoms worsen or fail to improve.  Curtis DELENA Boom, FNP  I, Curtis DELENA Boom, FNP, have reviewed all documentation for this visit. The documentation on 12/07/23 for the exam, diagnosis, procedures, and orders are all accurate and complete.

## 2023-12-07 NOTE — Telephone Encounter (Signed)
 Please see the message below, this pt is scheduled to see you today

## 2023-12-07 NOTE — Telephone Encounter (Signed)
 FYI Only or Action Required?: FYI only for provider.  Patient was last seen in primary care on 10/04/2023 by Myrla Jon HERO, MD.  Called Nurse Triage reporting Knee Pain from fall  Symptoms began about a month ago.  Interventions attempted: Other: Went to UC took ABX.  Symptoms are: gradually worsening. Pain and swelling.  Triage Disposition: See Physician Within 24 Hours  Patient/caregiver understands and will follow disposition?: Yes              Copied from CRM [id number]. Topic: Clinical - Red Word Triage >> Dec 07, 2023  8:30 AM Elle L wrote: Red Word that prompted transfer to Nurse Triage: The patient fell a month ago and she still has a knot on her knee. [facility] and they put her antibiotics which she has finished. However, it is still painful to walk. Reason for Disposition  [1] MODERATE pain (e.g., interferes with normal activities, limping) AND [2] high-risk adult (e.g., age > 60 years, osteoporosis, chronic steroid use)  Answer Assessment - Initial Assessment Questions 1. MECHANISM: How did the injury happen? (e.g., twisting injury, direct blow)      fell 2. ONSET: When did the injury happen? (e.g., minutes, hours ago)       1 month 3. LOCATION: Where is the injury located?      Right knee 4. APPEARANCE of INJURY: What does the injury look like?      Swollen - fluid 5. SEVERITY: Can you put weight on that leg? Can you walk?      Hurts to walk - can't sleep on her side 6. SIZE: For cuts, bruises, or swelling, ask: How large is it? (e.g., inches or centimeters;  entire joint)      Resolved 7. PAIN: Is there pain? If Yes, ask: How bad is the pain?   What does it keep you from doing? (Scale 0-10; or none, mild, moderate, severe)     Yes - moderate 9. OTHER SYMPTOMS: Do you have any other symptoms?  (e.g., pop when knee injured, swelling, locking, buckling)      Swelling  Protocols used: Knee  Injury-A-AH

## 2023-12-10 ENCOUNTER — Ambulatory Visit: Payer: Self-pay | Admitting: Family Medicine

## 2024-02-05 ENCOUNTER — Encounter: Payer: BC Managed Care – PPO | Admitting: Dermatology

## 2024-03-04 ENCOUNTER — Other Ambulatory Visit: Payer: Self-pay | Admitting: Family Medicine

## 2024-03-04 DIAGNOSIS — E7800 Pure hypercholesterolemia, unspecified: Secondary | ICD-10-CM

## 2024-03-19 ENCOUNTER — Encounter: Payer: Self-pay | Admitting: Dermatology

## 2024-03-19 ENCOUNTER — Ambulatory Visit (INDEPENDENT_AMBULATORY_CARE_PROVIDER_SITE_OTHER): Admitting: Dermatology

## 2024-03-19 DIAGNOSIS — L300 Nummular dermatitis: Secondary | ICD-10-CM

## 2024-03-19 DIAGNOSIS — L814 Other melanin hyperpigmentation: Secondary | ICD-10-CM

## 2024-03-19 DIAGNOSIS — L219 Seborrheic dermatitis, unspecified: Secondary | ICD-10-CM

## 2024-03-19 DIAGNOSIS — L820 Inflamed seborrheic keratosis: Secondary | ICD-10-CM | POA: Diagnosis not present

## 2024-03-19 DIAGNOSIS — L578 Other skin changes due to chronic exposure to nonionizing radiation: Secondary | ICD-10-CM | POA: Diagnosis not present

## 2024-03-19 DIAGNOSIS — D229 Melanocytic nevi, unspecified: Secondary | ICD-10-CM

## 2024-03-19 DIAGNOSIS — Z1283 Encounter for screening for malignant neoplasm of skin: Secondary | ICD-10-CM

## 2024-03-19 DIAGNOSIS — D1801 Hemangioma of skin and subcutaneous tissue: Secondary | ICD-10-CM

## 2024-03-19 DIAGNOSIS — L719 Rosacea, unspecified: Secondary | ICD-10-CM | POA: Diagnosis not present

## 2024-03-19 DIAGNOSIS — L821 Other seborrheic keratosis: Secondary | ICD-10-CM

## 2024-03-19 DIAGNOSIS — Z872 Personal history of diseases of the skin and subcutaneous tissue: Secondary | ICD-10-CM

## 2024-03-19 DIAGNOSIS — W908XXA Exposure to other nonionizing radiation, initial encounter: Secondary | ICD-10-CM

## 2024-03-19 DIAGNOSIS — L738 Other specified follicular disorders: Secondary | ICD-10-CM

## 2024-03-19 MED ORDER — FINACEA 15 % EX FOAM
CUTANEOUS | 11 refills | Status: AC
Start: 2024-03-19 — End: ?

## 2024-03-19 NOTE — Patient Instructions (Addendum)
 Cryotherapy Aftercare  Wash gently with soap and water everyday.   Apply Vaseline and Band-Aid daily until healed.    Recommend OTC Head & Shoulders shampoo 2-3x per week, massage into scalp and let sit 3-5 minutes before rinsing.  Recommend daily broad spectrum sunscreen SPF 30+ to sun-exposed areas, reapply every 2 hours as needed. Call for new or changing lesions.  Staying in the shade or wearing long sleeves, sun glasses (UVA+UVB protection) and wide brim hats (4-inch brim around the entire circumference of the hat) are also recommended for sun protection.     Due to recent changes in healthcare laws, you may see results of your pathology and/or laboratory studies on MyChart before the doctors have had a chance to review them. We understand that in some cases there may be results that are confusing or concerning to you. Please understand that not all results are received at the same time and often the doctors may need to interpret multiple results in order to provide you with the best plan of care or course of treatment. Therefore, we ask that you please give us  2 business days to thoroughly review all your results before contacting the office for clarification. Should we see a critical lab result, you will be contacted sooner.   If You Need Anything After Your Visit  If you have any questions or concerns for your doctor, please call our main line at [phone] and press option 4 to reach your doctor's medical assistant. If no one answers, please leave a voicemail as directed and we will return your call as soon as possible. Messages left after 4 pm will be answered the following business day.   You may also send us  a message via MyChart. We typically respond to MyChart messages within 1-2 business days.  For prescription refills, please ask your pharmacy to contact our office. Our fax number is [phone].  If you have an urgent issue when the clinic is closed that cannot wait until  the next business day, you can page your doctor at the number below.    Please note that while we do our best to be available for urgent issues outside of office hours, we are not available 24/7.   If you have an urgent issue and are unable to reach us , you may choose to seek medical care at your doctor's office, retail clinic, urgent care center, or emergency room.  If you have a medical emergency, please immediately call 911 or go to the emergency department.  Pager Numbers  - Dr. Hester: [phone]  - Dr. Jackquline: [phone]  - Dr. Claudene: [phone]   - Dr. Raymund: [phone]  In the event of inclement weather, please call our main line at [phone] for an update on the status of any delays or closures.  Dermatology Medication Tips: Please keep the boxes that topical medications come in in order to help keep track of the instructions about where and how to use these. Pharmacies typically print the medication instructions only on the boxes and not directly on the medication tubes.   If your medication is too expensive, please contact our office at [phone] option 4 or send us  a message through MyChart.   We are unable to tell what your co-pay for medications will be in advance as this is different depending on your insurance coverage. However, we may be able to find a substitute medication at lower cost or fill out paperwork to get insurance to cover a needed medication.  If a prior authorization is required to get your medication covered by your insurance company, please allow us  1-2 business days to complete this process.  Drug prices often vary depending on where the prescription is filled and some pharmacies may offer cheaper prices.  The website www.goodrx.com contains coupons for medications through different pharmacies. The prices here do not account for what the cost may be with help from insurance (it may be cheaper with your insurance), but the website can  give you the price if you did not use any insurance.  - You can print the associated coupon and take it with your prescription to the pharmacy.  - You may also stop by our office during regular business hours and pick up a GoodRx coupon card.  - If you need your prescription sent electronically to a different pharmacy, notify our office through [facility] or by phone at [phone] option 4.     Si Usted Necesita Algo Despus de Su Visita  Tambin puede enviarnos un mensaje a travs de Clinical Cytogeneticist. Por lo general respondemos a los mensajes de MyChart en el transcurso de 1 a 2 das hbiles.  Para renovar recetas, por favor pida a su farmacia que se ponga en contacto con nuestra oficina. Randi lakes de fax es [place] [phone].  Si tiene un asunto urgente cuando la clnica est cerrada y que no puede esperar hasta el siguiente da hbil, puede llamar/localizar a su doctor(a) al nmero que aparece a continuacin.   Por favor, tenga en cuenta que aunque hacemos todo lo posible para estar disponibles para asuntos urgentes fuera del horario de [place], no estamos disponibles las 24 horas del da, los 7 [address]  [address] de la [place].   Si tiene un problema urgente y no puede comunicarse con nosotros, puede optar por buscar atencin mdica  en el consultorio de su doctor(a), en una clnica privada, en un centro de atencin urgente o en una sala de emergencias.  Si tiene engineer, drilling, por favor llame inmediatamente al 911 o vaya a la sala de emergencias.  Nmeros de bper  - Dr. Hester: [phone]  - Dra. Jackquline: [phone]  - Dr. Claudene: [phone]  - Dra. Kitts: [phone]  En caso de inclemencias del [place], por favor llame a nuestra lnea principal al [phone] para una actualizacin sobre el estado de cualquier retraso o cierre.  Consejos para la medicacin en dermatologa: Por favor, guarde las cajas en las que vienen los medicamentos de uso tpico para ayudarle a  seguir las instrucciones sobre dnde y cmo usarlos. Las farmacias generalmente imprimen las instrucciones del medicamento slo en las cajas y no directamente en los tubos del [place].   Si su medicamento es muy caro, por favor, pngase en contacto con landry rieger llamando al [phone] y presione la opcin 4 o envenos un mensaje a travs de Clinical Cytogeneticist.   No podemos decirle cul ser su copago por los medicamentos por adelantado ya que esto es diferente dependiendo de la cobertura de su seguro. Sin embargo, es posible que podamos encontrar un medicamento sustituto a audiological scientist un formulario para que el seguro cubra el medicamento que se considera necesario.   Si se requiere una autorizacin previa para que su compaa de seguros cubra su medicamento, por favor permtanos de 1 a 2 das hbiles para completar este proceso.  Los precios de los medicamentos varan con frecuencia dependiendo del environmental consultant de dnde se surte la receta y alguna farmacias pueden ofrecer precios ms baratos.  El sitio web www.goodrx.com tiene cupones para medicamentos de health and safety inspector. Los precios aqu no tienen en cuenta lo que podra costar con la ayuda del seguro (puede ser ms barato con su seguro), pero el sitio web puede darle el precio si no utiliz tourist information centre manager.  - Puede imprimir el cupn correspondiente y llevarlo con su receta a la farmacia.  - Tambin puede pasar por nuestra oficina durante el horario de atencin regular y education officer, museum una tarjeta de cupones de GoodRx.  - Si necesita que su receta se enve electrnicamente a una farmacia diferente, informe a nuestra oficina a travs de MyChart de [place] o por telfono llamando al [phone] y presione la opcin 4.

## 2024-03-19 NOTE — Progress Notes (Signed)
 Follow-Up Visit   Subjective  Rachel Ali is a 63 y.o. female who presents for the following: Skin Cancer Screening and Full Body Skin Exam hx of Aks, Rosacea face, Finacea  foam ~qd, check spots arms, Itchy spot R post thigh, scratches at it, itching R arm tried Mometasone  cr did not help, now using Coconut oil which works better.  The patient presents for Total-Body Skin Exam (TBSE) for skin cancer screening and mole check. The patient has spots, moles and lesions to be evaluated, some may be new or changing and the patient may have concern these could be cancer.    The following portions of the chart were reviewed this encounter and updated as appropriate: medications, allergies, medical history  Review of Systems:  No other skin or systemic complaints except as noted in HPI or Assessment and Plan.  Objective  Well appearing patient in no apparent distress; mood and affect are within normal limits.  A full examination was performed including scalp, head, eyes, ears, nose, lips, neck, chest, axillae, abdomen, back, buttocks, bilateral upper extremities, bilateral lower extremities, hands, feet, fingers, toes, fingernails, and toenails. All findings within normal limits unless otherwise noted below.   Relevant physical exam findings are noted in the Assessment and Plan.  R post thigh x 1 Stuck on waxy papule with erythema  Assessment & Plan   SKIN CANCER SCREENING PERFORMED TODAY.  ACTINIC DAMAGE chest - Chronic condition, secondary to cumulative UV/sun exposure - diffuse scaly erythematous macules with underlying dyspigmentation - Recommend daily broad spectrum sunscreen SPF 30+ to sun-exposed areas, reapply every 2 hours as needed.  - Staying in the shade or wearing long sleeves, sun glasses (UVA+UVB protection) and wide brim hats (4-inch brim around the entire circumference of the hat) are also recommended for sun protection.  - Call for new or changing lesions.  LENTIGINES,  SEBORRHEIC KERATOSES, HEMANGIOMAS - Benign normal skin lesions - Benign-appearing - Call for any changes -SKs arms  MELANOCYTIC NEVI - Tan-brown and/or pink-flesh-colored symmetric macules and papules - Benign appearing on exam today - Observation - Call clinic for new or changing moles - Recommend daily use of broad spectrum spf 30+ sunscreen to sun-exposed areas.   HISTORY OF PRECANCEROUS ACTINIC KERATOSIS - site(s) of PreCancerous Actinic Keratosis clear today. 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 Condition is improving with treatment but not currently at goal.  Rosacea is a chronic progressive skin condition usually affecting the face of adults, causing redness and/or acne bumps. It is treatable but not curable. It sometimes affects the eyes (ocular rosacea) as well. It may respond to topical and/or systemic medication and can flare with stress, sun exposure, alcohol, exercise, topical steroids (including hydrocortisone/cortisone 10) and some foods.  Daily application of broad spectrum spf 30+ sunscreen to face is recommended to reduce flares.  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 Condition is improving with treatment but not currently at  goal.  Nummular dermatitis (eczema) is a chronic, relapsing, itchy rash that can significantly affect quality of life. It is often associated with dry skin and flares in the wintertime, and may require treatment with prescription topical anti-inflammatory medications, in addition to gentle skin care.  If there is associated atopic dermatitis and topicals are not working, then biologic injections may be necessary to clear rash and control symptoms.  Treatment Plan: Cont Coconut oil prn itch, pt prefers it over Rx topical steroid  Recommend mild soap and moisturizing cream 1-2 times daily.  Gentle skin care handout provided.    SEBORRHEIC DERMATITIS Exam: mild scale scalp, pt reports itching  Chronic and persistent condition with duration or expected duration over one year. Condition is symptomatic / bothersome to patient. Not to goal.  Seborrheic Dermatitis is a chronic persistent rash characterized by pinkness and scaling most commonly of the mid face but also can occur on the scalp (dandruff), ears; mid chest, mid back and groin.  It tends to be exacerbated by stress and cooler weather.  People who have neurologic disease may experience new onset or exacerbation of existing seborrheic dermatitis.  The condition is not curable but treatable and can be controlled.  Treatment Plan: Recommend OTC Head & Shoulders shampoo 2-3x per week, massage into scalp and let sit 3-5 minutes before rinsing.     Sebaceous Hyperplasia vs SK R cheek - Small flesh/white papule - Benign-appearing - Observe. Call for changes.   ROSACEA   Related Medications Azelaic Acid  (FINACEA ) 15 % FOAM Apply to face once to twice a day for rosacea. INFLAMED SEBORRHEIC KERATOSIS R post thigh x 1 Symptomatic, irritating, patient would like treated. Destruction of lesion - R post thigh x 1  Destruction method: cryotherapy   Informed consent: discussed and consent obtained   Lesion destroyed using liquid nitrogen: Yes    Region frozen until ice ball extended beyond lesion: Yes   Outcome: patient tolerated procedure well with no complications   Post-procedure details: wound care instructions given   Additional details:  Prior to procedure, discussed risks of blister formation, small wound, skin dyspigmentation, or rare scar following cryotherapy. Recommend Vaseline ointment to treated areas while healing.   Return in about 1 year (around 03/19/2025) for TBSE, Hx of AKs, Rosacea.  I, Grayce Saunas, RMA, am acting as scribe for Rexene Rattler, MD .   Documentation: I have reviewed the above documentation for accuracy and completeness, and I agree with the above.  Rexene Rattler, MD

## 2024-04-08 ENCOUNTER — Ambulatory Visit: Admitting: Family Medicine

## 2024-04-08 ENCOUNTER — Encounter: Payer: Self-pay | Admitting: Family Medicine

## 2024-04-08 VITALS — BP 122/70 | HR 72 | Ht 64.0 in | Wt 178.1 lb

## 2024-04-08 DIAGNOSIS — I10 Essential (primary) hypertension: Secondary | ICD-10-CM | POA: Diagnosis not present

## 2024-04-08 DIAGNOSIS — Z1231 Encounter for screening mammogram for malignant neoplasm of breast: Secondary | ICD-10-CM

## 2024-04-08 DIAGNOSIS — R7303 Prediabetes: Secondary | ICD-10-CM

## 2024-04-08 DIAGNOSIS — Z23 Encounter for immunization: Secondary | ICD-10-CM | POA: Diagnosis not present

## 2024-04-08 DIAGNOSIS — E6609 Other obesity due to excess calories: Secondary | ICD-10-CM

## 2024-04-08 DIAGNOSIS — G4700 Insomnia, unspecified: Secondary | ICD-10-CM

## 2024-04-08 DIAGNOSIS — Z6830 Body mass index (BMI) 30.0-30.9, adult: Secondary | ICD-10-CM

## 2024-04-08 DIAGNOSIS — E66811 Obesity, class 1: Secondary | ICD-10-CM | POA: Diagnosis not present

## 2024-04-08 DIAGNOSIS — E7800 Pure hypercholesterolemia, unspecified: Secondary | ICD-10-CM | POA: Diagnosis not present

## 2024-04-08 DIAGNOSIS — F419 Anxiety disorder, unspecified: Secondary | ICD-10-CM

## 2024-04-08 MED ORDER — PHENTERMINE HCL 37.5 MG PO CAPS: 37.5000 mg | ORAL_CAPSULE | ORAL | 2 refills | Status: AC

## 2024-04-08 NOTE — Assessment & Plan Note (Signed)
 Blood pressure is well-controlled with current medication regimen. - Continue triamterene -HCTZ 37.5-25 mg daily.

## 2024-04-08 NOTE — Assessment & Plan Note (Signed)
 Weight management is ongoing with phentermine  for weight loss. She has been off phentermine  for three months and wishes to restart it, especially with the upcoming Christmas season. - Restarted phentermine  for weight loss.

## 2024-04-08 NOTE — Patient Instructions (Signed)
 Call [facility] to schedule a mammogram [phone]

## 2024-04-08 NOTE — Assessment & Plan Note (Signed)
 Cholesterol levels are being monitored with atorvastatin . - Continue atorvastatin  10 mg daily. - Ordered cholesterol panel.

## 2024-04-08 NOTE — Assessment & Plan Note (Signed)
 A1c levels are being monitored to assess glycemic control. - Ordered A1c test.

## 2024-04-08 NOTE — Progress Notes (Signed)
 Established patient visit   Patient: Rachel Ali   DOB: 1960-12-18   63 y.o. Female  MRN: [id number] Visit Date: 04/08/2024  Today's healthcare provider: Jon Eva, MD   Chief Complaint  Patient presents with   Medical Management of Chronic Issues   Hypertension    She reports monitoring on occasions, she does not smoke and currently taking a decongestant but has not taken anything this morning. She reports consuming a low sodium diet. Symptoms: lower leg edema   Hyperlipidemia    No symptoms to report   Anxiety    She feels her anxiety is mild and Unchanged since last visit. Symptoms: none   Weight Check    Would like to go back onto phentermine .    Subjective     Discussed the use of AI scribe software for clinical note transcription with the patient, who gave verbal consent to proceed.  History of Present Illness   Rachel Ali is a 63 year old female with hypertension, hyperlipidemia, prediabetes, obesity, insomnia, and anxiety who presents for a chronic follow-up.  She takes atorvastatin , triamterene -HCTZ, and sertraline  daily. Trazodone  is used effectively for insomnia. She is uncertain about her current use of phentermine  for weight loss.  Anxiety is present, exacerbated by recent family losses. Mild swelling occurs in the summer but is not significant currently.  A knee injury from a fall earlier this year remains numb and feels bony. Hip pain is noted, possibly related to lifting boxes. Exercise several times a week helps alleviate knee pain, though daily exercise is limited by time.  Occasional head twitching occurs without loss of strength, sensation, or tremors in her hands. She is concerned about misplacing jewelry but denies misplacing daily items.  She has not received a flu shot this year and is unsure about the pneumonia vaccine. She plans to schedule a mammogram in January.          04/08/2024   10:28 AM 10/04/2023   10:08 AM 03/09/2022   11:22  AM  GAD 7 : Generalized Anxiety Score  Nervous, Anxious, on Edge 0 1 1  Control/stop worrying 0 0 0  Worry too much - different things 0 0 0  Trouble relaxing 0 0 0  Restless 1 0 0  Easily annoyed or irritable 0 0 0  Afraid - awful might happen 1 1 1   Total GAD 7 Score 2 2 2   Anxiety Difficulty Not difficult at all  Not difficult at all       04/08/2024   10:27 AM 10/04/2023   10:08 AM 01/04/2023   10:24 AM 10/02/2022    4:11 PM 03/09/2022   11:22 AM  Depression screen PHQ 2/9  Decreased Interest 1 0 0 0 0  Down, Depressed, Hopeless 0 1 0 0 0  PHQ - 2 Score 1 1 0 0 0  Altered sleeping 1 0  0 0  Tired, decreased energy 1 1  1  0  Change in appetite 1 1  0 0  Feeling bad or failure about yourself  0 0  0 0  Trouble concentrating 0 0  0 0  Moving slowly or fidgety/restless 0 0  0 0  Suicidal thoughts 0 0  0 0  PHQ-9 Score 4 3   1   0   Difficult doing work/chores Not difficult at all Not difficult at all  Not difficult at all Not difficult at all     Data saved with a previous  flowsheet row definition     Medications: Outpatient Medications Prior to Visit  Medication Sig   acidophilus (RISAQUAD) CAPS capsule Take 1 capsule by mouth daily.   atorvastatin  (LIPITOR) 10 MG tablet TAKE 1 TABLET BY MOUTH EVERY DAY   Azelaic Acid  (FINACEA ) 15 % FOAM Apply to face once to twice a day for rosacea.   Cholecalciferol 1000 units capsule Take by mouth.   famotidine (PEPCID) 20 MG tablet Take 20 mg by mouth 2 (two) times daily.   FLAXSEED, LINSEED, PO Take by mouth.   meloxicam  (MOBIC ) 15 MG tablet TAKE 1 TABLET (15 MG TOTAL) BY MOUTH DAILY.   mometasone  (ELOCON ) 0.1 % cream Apply 1 application topically daily as needed (Rash). Avoid applying to face, groin, and axilla. Use as directed. Risk of skin atrophy with long-term use reviewed.   Multiple Vitamin tablet Take by mouth.   sertraline  (ZOLOFT ) 50 MG tablet TAKE 1 TABLET BY MOUTH EVERY DAY   traZODone  (DESYREL ) 50 MG tablet TAKE 1  TABLET BY MOUTH EVERYDAY AT BEDTIME   triamterene -hydrochlorothiazide (DYAZIDE) 37.5-25 MG capsule TAKE 1 CAPSULE BY MOUTH EVERY DAY   vitamin E 180 MG (400 UNITS) capsule Take 400 Units by mouth daily.   [DISCONTINUED] phentermine  37.5 MG capsule Take 1 capsule (37.5 mg total) by mouth every morning.   No facility-administered medications prior to visit.    Review of Systems     Objective    BP 122/70 (BP Location: Left Arm, Patient Position: Sitting, Cuff Size: Large)   Pulse 72   Ht 5' 4 (1.626 m)   Wt 178 lb 1.6 oz (80.8 kg)   SpO2 98%   BMI 30.57 kg/m    Physical Exam Vitals reviewed.  Constitutional:      General: She is not in acute distress.    Appearance: Normal appearance. She is well-developed. She is not diaphoretic.  HENT:     Head: Normocephalic and atraumatic.  Eyes:     General: No scleral icterus.    Conjunctiva/sclera: Conjunctivae normal.  Neck:     Thyroid: No thyromegaly.  Cardiovascular:     Rate and Rhythm: Normal rate and regular rhythm.     Heart sounds: Normal heart sounds. No murmur heard. Pulmonary:     Effort: Pulmonary effort is normal. No respiratory distress.     Breath sounds: Normal breath sounds. No wheezing, rhonchi or rales.  Musculoskeletal:     Cervical back: Neck supple.     Right lower leg: No edema.     Left lower leg: No edema.  Lymphadenopathy:     Cervical: No cervical adenopathy.  Skin:    General: Skin is warm and dry.     Findings: No rash.  Neurological:     Mental Status: She is alert and oriented to person, place, and time. Mental status is at baseline.  Psychiatric:        Mood and Affect: Mood normal.        Behavior: Behavior normal.      No results found for any visits on 04/08/24.  Assessment & Plan     Problem List Items Addressed This Visit       Cardiovascular and Mediastinum   Hypertension - Primary   Blood pressure is well-controlled with current medication regimen. - Continue  triamterene -HCTZ 37.5-25 mg daily.      Relevant Orders   Comprehensive metabolic panel with GFR     Other   Insomnia   Sleep is well-managed with  trazodone , taken nightly at a low dose. No concerns about dementia risk or habit formation with trazodone . - Continue trazodone  50 mg nightly.      Anxiety   Anxiety is well-controlled with sertraline . - Continue sertraline  50 mg daily.      Prediabetes   A1c levels are being monitored to assess glycemic control. - Ordered A1c test.      Relevant Orders   Hemoglobin A1c   Obese   Weight management is ongoing with phentermine  for weight loss. She has been off phentermine  for three months and wishes to restart it, especially with the upcoming Christmas season. - Restarted phentermine  for weight loss.      Relevant Medications   phentermine  37.5 MG capsule   Hypercholesterolemia   Cholesterol levels are being monitored with atorvastatin . - Continue atorvastatin  10 mg daily. - Ordered cholesterol panel.      Relevant Orders   Comprehensive metabolic panel with GFR   Lipid panel   Other Visit Diagnoses       Breast cancer screening by mammogram       Relevant Orders   MM 3D SCREENING MAMMOGRAM BILATERAL BREAST     Immunization due       Relevant Orders   Flu vaccine trivalent PF, 6mos and older(Flulaval,Afluria,Fluarix,Fluzone) (Completed)   Pneumococcal conjugate vaccine 20-valent (Completed)           General Health Maintenance Discussed the importance of vaccinations and screenings. She is due for a pneumonia vaccine and a mammogram. - Administered pneumonia vaccine. - Ordered mammogram and instructed patient to schedule. - Administered flu vaccine.       Return in about 6 months (around 10/06/2024) for CPE.       Jon Eva, MD  [facility] [phone] (phone) [phone] (fax)  [facility]

## 2024-04-08 NOTE — Assessment & Plan Note (Signed)
 Sleep is well-managed with trazodone , taken nightly at a low dose. No concerns about dementia risk or habit formation with trazodone . - Continue trazodone  50 mg nightly.

## 2024-04-08 NOTE — Assessment & Plan Note (Signed)
 Anxiety is well-controlled with sertraline . - Continue sertraline  50 mg daily.

## 2024-04-09 ENCOUNTER — Ambulatory Visit: Payer: Self-pay | Admitting: Family Medicine

## 2024-04-09 LAB — HEMOGLOBIN A1C
Est. average glucose Bld gHb Est-mCnc: 123 mg/dL
Hgb A1c MFr Bld: 5.9 % — ABNORMAL HIGH (ref 4.8–5.6)

## 2024-04-09 LAB — LIPID PANEL
Chol/HDL Ratio: 2.8 ratio (ref 0.0–4.4)
Cholesterol, Total: 151 mg/dL (ref 100–199)
HDL: 54 mg/dL (ref >39)
LDL Chol Calc (NIH): 74 mg/dL (ref 0–99)
Triglycerides: 129 mg/dL (ref 0–149)
VLDL Cholesterol Cal: 23 mg/dL (ref 5–40)

## 2024-04-09 LAB — COMPREHENSIVE METABOLIC PANEL WITH GFR
ALT: 20 IU/L (ref 0–32)
AST: 23 IU/L (ref 0–40)
Albumin: 4.6 g/dL (ref 3.9–4.9)
Alkaline Phosphatase: 138 IU/L — ABNORMAL HIGH (ref 49–135)
BUN/Creatinine Ratio: 16 (ref 12–28)
BUN: 15 mg/dL (ref 8–27)
Bilirubin Total: 0.4 mg/dL (ref 0.0–1.2)
CO2: 28 mmol/L (ref 20–29)
Calcium: 9.8 mg/dL (ref 8.7–10.3)
Chloride: 101 mmol/L (ref 96–106)
Creatinine, Ser: 0.93 mg/dL (ref 0.57–1.00)
Globulin, Total: 2.3 g/dL (ref 1.5–4.5)
Glucose: 101 mg/dL — ABNORMAL HIGH (ref 70–99)
Potassium: 3.9 mmol/L (ref 3.5–5.2)
Sodium: 143 mmol/L (ref 134–144)
Total Protein: 6.9 g/dL (ref 6.0–8.5)
eGFR: 69 mL/min/1.73 (ref >59)

## 2024-05-29 ENCOUNTER — Other Ambulatory Visit: Payer: Self-pay | Admitting: Family Medicine

## 2024-05-29 DIAGNOSIS — I10 Essential (primary) hypertension: Secondary | ICD-10-CM

## 2024-05-29 DIAGNOSIS — F419 Anxiety disorder, unspecified: Secondary | ICD-10-CM

## 2024-06-05 ENCOUNTER — Ambulatory Visit
Admission: RE | Admit: 2024-06-05 | Discharge: 2024-06-05 | Disposition: A | Discharge status: Home / Self Care | Source: Ambulatory Visit | Attending: Family Medicine | Admitting: Family Medicine

## 2024-06-05 DIAGNOSIS — Z1231 Encounter for screening mammogram for malignant neoplasm of breast: Secondary | ICD-10-CM | POA: Diagnosis present

## 2024-06-13 ENCOUNTER — Ambulatory Visit: Payer: Self-pay

## 2024-06-13 ENCOUNTER — Encounter: Payer: Self-pay | Admitting: Nurse Practitioner

## 2024-06-13 ENCOUNTER — Ambulatory Visit
Admission: RE | Admit: 2024-06-13 | Discharge: 2024-06-13 | Disposition: A | Attending: Nurse Practitioner | Admitting: Nurse Practitioner

## 2024-06-13 ENCOUNTER — Ambulatory Visit
Admission: RE | Admit: 2024-06-13 | Discharge: 2024-06-13 | Disposition: A | Source: Ambulatory Visit | Attending: Nurse Practitioner

## 2024-06-13 ENCOUNTER — Ambulatory Visit: Admitting: Nurse Practitioner

## 2024-06-13 VITALS — BP 116/70 | HR 61 | Temp 98.0°F | Ht 64.0 in | Wt 176.8 lb

## 2024-06-13 DIAGNOSIS — M25472 Effusion, left ankle: Secondary | ICD-10-CM | POA: Insufficient documentation

## 2024-06-13 DIAGNOSIS — M79605 Pain in left leg: Secondary | ICD-10-CM | POA: Diagnosis not present

## 2024-06-13 NOTE — Telephone Encounter (Signed)
 FYI Only or Action Required?: FYI only for provider: appointment scheduled on 06/13/24.  Patient was last seen in primary care on 04/08/2024 by Myrla Jon HERO, MD.  Called Nurse Triage reporting Foot Pain and Ankle Pain.  Symptoms began several months ago.  Interventions attempted: OTC medications: 800 mg ibuprofen.  Symptoms are: gradually worsening.  Triage Disposition: See Physician Within 24 Hours  Patient/caregiver understands and will follow disposition?:   Reason for Disposition  [1] Limp when walking AND [2] due to a twisted ankle or foot  Answer Assessment - Initial Assessment Questions Pt reports that she [place] 3 [place] and she struck her foot in a drawer, causing her to fall. Experiencing pain to her left ankle and foot. Pain varies, but is severe by the end of the day. Has been using a wrap with no improvement.   1. MECHANISM: How did the injury happen? (e.g., twisting injury, direct blow)      See above 2. ONSET: When did the injury happen? (Minutes or hours ago)      3 months 3. LOCATION: Where is the injury located?      Left ankle/pain 4. APPEARANCE of INJURY: What does the injury look like?      Denies change in appearance 5. WEIGHT-BEARING: Can you put weight on that foot? Can you walk (four steps or more)?       Able to ambulate but painful  Protocols used: Ankle and Foot Injury-A-AH  Message from Sinclair S sent at 06/13/2024 10:18 AM EST  Summary: worsening foot & ankle pain   Reason for Triage: worsening foot & ankle pain .. injured 3 months ago and hasn't improved

## 2024-06-13 NOTE — Patient Instructions (Signed)
 Please go for ankle Xray to  [facility] [address] [place], [place] [address] [phone]

## 2024-06-13 NOTE — Progress Notes (Signed)
 "  Established Patient Office Visit  Subjective:  Patient ID: Rachel Ali, female    DOB: 31-Oct-1960  Age: 64 y.o. MRN: [id number]  CC:  Chief Complaint  Patient presents with   Acute Visit    Left foot & ankle injury x 3 months ago Little more pink than the Right Foot Pain 1-2/10 in the AM & 6-7/10 by the end of the day   Discussed the use of AI scribe software for clinical note transcription with the patient, who gave verbal consent to proceed.  History of Present Illness  Discussed the use of AI scribe software for clinical note transcription with the patient, who gave verbal consent to proceed.  History of Present Illness   Rachel Ali is a 64 year old female who presents with right foot pain and swelling.  She describes right foot pain as an electric sensation into the toes with popping during walking. Pain is mild in the morning and worsens through the day. Ice, elevation, and ibuprofen help, but ibuprofen causes nausea.  Pain began after she lost her balance and fell about 3 months ago while making a bed on a trip, landing with her foot caught in a drawer. It was initially mild and has progressively worsened over the past month.  Compression stockings and massage have not helped. She can bear weight but it is uncomfortable and causes a limp. Pain has recently limited her usual treadmill walking.        Past Medical History:  Diagnosis Date   Actinic keratosis    Allergy    sinus problem   Anxiety Always   Arthritis 10 years ago   Hips   Depression Always   Hypertension 3 years ago    Past Surgical History:  Procedure Laterality Date   COLONOSCOPY WITH PROPOFOL  N/A 08/29/2019   Procedure: COLONOSCOPY WITH PROPOFOL ;  Surgeon: Unk Corinn Skiff, MD;  Location: [facility] ENDOSCOPY;  Service: Gastroenterology;  Laterality: N/A;   DILATION AND CURETTAGE OF UTERUS     ESSURE TUBAL LIGATION     OTHER SURGICAL HISTORY     coil placement in tubes to prevent pregnancy-patient  could not recall the name of the procedure   TONSILLECTOMY     TUBAL LIGATION      Family History  Problem Relation Age of Onset   Hypertension Mother    Uterine cancer Mother    Heart disease Father    Hypertension Father    Cancer Father    Hypertension Brother    Congestive Heart Failure Maternal Grandmother    Heart disease Maternal Grandfather    Congestive Heart Failure Paternal Grandmother    Stroke Paternal Grandfather    Breast cancer Neg Hx     Social History   Socioeconomic History   Marital status: Married    Spouse name: Sonny   Number of children: 1   Years of education: Associates   Highest education level: Associate degree: occupational, scientist, product/process development, or vocational program  Occupational History    Employer: [employer]  Tobacco Use   Smoking status: Former    Current packs/day: 0.00    Average packs/day: 0.5 packs/day for 3.0 years (1.5 ttl pk-yrs)    Types: Cigarettes    Start date: 05/22/1990    Quit date: 05/22/1993    Years since quitting: 31.0   Smokeless tobacco: Never  Vaping Use   Vaping status: Never Used  Substance and Sexual Activity   Alcohol use: No    Comment:  rare   Drug use: No   Sexual activity: Yes    Birth control/protection: Post-menopausal  Other Topics Concern   Not on file  Social History Narrative   Not on file   Social Drivers of Health   Tobacco Use: Medium Risk (06/13/2024)   Patient History    Smoking Tobacco Use: Former    Smokeless Tobacco Use: Never    Passive Exposure: Not on file  Financial Resource Strain: Low Risk (09/30/2023)   Overall Financial Resource Strain (CARDIA)    Difficulty of Paying Living Expenses: Not hard at all  Food Insecurity: No Food Insecurity (09/30/2023)   Hunger Vital Sign    Worried About Running Out of Food in the Last Year: Never true    Ran Out of Food in the Last Year: Never true  Transportation Needs: No Transportation Needs (09/30/2023)   PRAPARE - Scientist, Research (physical Sciences) (Medical): No    Lack of Transportation (Non-Medical): No  Physical Activity: Sufficiently Active (09/30/2023)   Exercise Vital Sign    Days of Exercise per Week: 5 days    Minutes of Exercise per Session: 30 min  Stress: No Stress Concern Present (09/30/2023)   [employer] of Occupational Health - Occupational Stress Questionnaire    Feeling of Stress : Only a little  Social Connections: Unknown (09/30/2023)   Social Connection and Isolation Panel    Frequency of Communication with Friends and Family: More than three times a week    Frequency of Social Gatherings with Friends and Family: Three times a week    Attends Religious Services: Patient declined    Active Member of Clubs or Organizations: No    Attends Banker Meetings: Not on file    Marital Status: Married  Catering Manager Violence: Not on file  Depression (PHQ2-9): Low Risk (06/13/2024)   Depression (PHQ2-9)    PHQ-2 Score: 0  Alcohol Screen: Low Risk (10/02/2022)   Alcohol Screen    Last Alcohol Screening Score (AUDIT): 1  Housing: Unknown (09/30/2023)   Housing Stability Vital Sign    Unable to Pay for Housing in the Last Year: No    Number of Times Moved in the Last Year: Not on file    Homeless in the Last Year: No  Utilities: Not on file  Health Literacy: Not on file     Outpatient Medications Prior to Visit  Medication Sig Dispense Refill   acidophilus (RISAQUAD) CAPS capsule Take 1 capsule by mouth daily.     atorvastatin  (LIPITOR) 10 MG tablet TAKE 1 TABLET BY MOUTH EVERY DAY 90 tablet 2   Azelaic Acid  (FINACEA ) 15 % FOAM Apply to face once to twice a day for rosacea. 50 g 11   Cholecalciferol 1000 units capsule Take by mouth.     famotidine (PEPCID) 20 MG tablet Take 20 mg by mouth 2 (two) times daily.     FLAXSEED, LINSEED, PO Take by mouth.     meloxicam  (MOBIC ) 15 MG tablet TAKE 1 TABLET (15 MG TOTAL) BY MOUTH DAILY. 90 tablet 0   mometasone  (ELOCON ) 0.1 % cream Apply 1  application topically daily as needed (Rash). Avoid applying to face, groin, and axilla. Use as directed. Risk of skin atrophy with long-term use reviewed. 45 g 1   Multiple Vitamin tablet Take by mouth.     phentermine  37.5 MG capsule Take 1 capsule (37.5 mg total) by mouth every morning. 30 capsule 2   sertraline  (ZOLOFT ) 50  MG tablet TAKE 1 TABLET BY MOUTH EVERY DAY 90 tablet 1   traZODone  (DESYREL ) 50 MG tablet TAKE 1 TABLET BY MOUTH EVERYDAY AT BEDTIME 90 tablet 0   triamterene -hydrochlorothiazide (DYAZIDE) 37.5-25 MG capsule TAKE 1 CAPSULE BY MOUTH EVERY DAY 90 capsule 1   vitamin E 180 MG (400 UNITS) capsule Take 400 Units by mouth daily.     No facility-administered medications prior to visit.    Allergies[1]  ROS Review of Systems Negative unless indicated in HPI.    Objective:    Physical Exam  BP 116/70   Pulse 61   Temp 98 F (36.7 C)   Ht 5' 4 (1.626 m)   Wt 176 lb 12.8 oz (80.2 kg)   SpO2 96%   BMI 30.35 kg/m  Wt Readings from Last 3 Encounters:  06/13/24 176 lb 12.8 oz (80.2 kg)  04/08/24 178 lb 1.6 oz (80.8 kg)  12/07/23 176 lb 9.6 oz (80.1 kg)     Health Maintenance  Topic Date Due   COVID-19 Vaccine (6 - 2025-26 season) 01/21/2024   Colonoscopy  08/28/2024   Cervical Cancer Screening (HPV/Pap Cotest)  03/26/2025   Mammogram  06/05/2025   DTaP/Tdap/Td (2 - Td or Tdap) 03/26/2030   Pneumococcal Vaccine: 50+ Years  Completed   Influenza Vaccine  Completed   HPV VACCINES (No Doses Required) Completed   Hepatitis C Screening  Completed   HIV Screening  Completed   Zoster Vaccines- Shingrix   Completed   Hepatitis B Vaccines 19-59 Average Risk  Aged Out   Meningococcal B Vaccine  Aged Out    There are no preventive care reminders to display for this patient.  Lab Results  Component Value Date   TSH 1.490 03/26/2020   Lab Results  Component Value Date   WBC 4.6 03/26/2020   HGB 13.3 03/26/2020   HCT 38.6 03/26/2020   MCV 93 03/26/2020    PLT 248 03/26/2020   Lab Results  Component Value Date   NA 143 04/08/2024   K 3.9 04/08/2024   CO2 28 04/08/2024   GLUCOSE 101 (H) 04/08/2024   BUN 15 04/08/2024   CREATININE 0.93 04/08/2024   BILITOT 0.4 04/08/2024   ALKPHOS 138 (H) 04/08/2024   AST 23 04/08/2024   ALT 20 04/08/2024   PROT 6.9 04/08/2024   ALBUMIN 4.6 04/08/2024   CALCIUM  9.8 04/08/2024   EGFR 69 04/08/2024   Lab Results  Component Value Date   CHOL 151 04/08/2024   Lab Results  Component Value Date   HDL 54 04/08/2024   Lab Results  Component Value Date   LDLCALC 74 04/08/2024   Lab Results  Component Value Date   TRIG 129 04/08/2024   Lab Results  Component Value Date   CHOLHDL 2.8 04/08/2024   Lab Results  Component Value Date   HGBA1C 5.9 (H) 04/08/2024      Assessment & Plan:   Assessment & Plan Pain of left lower extremity due to injury Patient had a fall 3 months ago and injured her left foot and ankle.  No prior imaging done after fall. Symptoms worsened recently with pain on ambulation.  - Ordered x-ray of left ankle at outpatient imaging center on [address]. - Advised leg elevation and use of Ace wrap for support. - Recommended heating therapy to increase circulation and aid healing. - Referred to Ortho for further evaluation. Orders:   DG Ankle Complete Left; Future   Ambulatory referral to Orthopedic Surgery  Assessment & Plan    Follow-up: Return if symptoms worsen or fail to improve.   Chelsea Aurora, NP     [1]  Allergies Allergen Reactions   Cinnamon     mild Trouble breathing.   Latex    Penicillins     Itching   "

## 2024-06-17 NOTE — Telephone Encounter (Signed)
 Noted.

## 2024-06-18 ENCOUNTER — Ambulatory Visit: Payer: Self-pay | Admitting: Nurse Practitioner

## 2024-06-18 NOTE — Progress Notes (Signed)
 Please inform patient: The x-ray shows no fracture but there is some swelling around the ankle from the injury. As discussed during the office visit I would send referral to Ortho for further evaluation.

## 2024-06-19 DIAGNOSIS — M79605 Pain in left leg: Secondary | ICD-10-CM | POA: Insufficient documentation

## 2024-06-19 NOTE — Telephone Encounter (Signed)
 Called pt and let her know that the referral has been entered but I do not see that an office has been assigned.  Told her to expect a letter in [place] with the office name and phone number so that she can make an appointment.  Pt verbalized understanding.

## 2024-06-19 NOTE — Assessment & Plan Note (Signed)
 Patient had a fall 3 months ago and injured her left foot and ankle.  No prior imaging done after fall. Symptoms worsened recently with pain on ambulation.  - Ordered x-ray of left ankle at outpatient imaging center on [address]. - Advised leg elevation and use of Ace wrap for support. - Recommended heating therapy to increase circulation and aid healing. - Referred to Ortho for further evaluation. Orders:   DG Ankle Complete Left; Future   Ambulatory referral to Orthopedic Surgery

## 2024-06-19 NOTE — Telephone Encounter (Signed)
 Copied from CRM #[id number]. Topic: Referral - Question >> Jun 19, 2024 11:53 AM Revonda D wrote: Reason for CRM: Pt was informed of xray results and wants to know which office she is being referred to for ortho. Pt would like a callback with the name and number for the office.

## 2024-10-14 ENCOUNTER — Encounter: Admitting: Family Medicine

## 2024-10-21 ENCOUNTER — Encounter: Admitting: Family Medicine

## 2025-03-16 ENCOUNTER — Ambulatory Visit: Admitting: Dermatology
